# Patient Record
Sex: Female | Born: 1980 | Race: White | Hispanic: Yes | Marital: Single | Smoking: Never smoker
Health system: Southern US, Community
[De-identification: ages and names within clinical notes are randomized; demographics above are authoritative.]

## PROBLEM LIST (undated history)

## (undated) DIAGNOSIS — Z8759 Personal history of other complications of pregnancy, childbirth and the puerperium: Secondary | ICD-10-CM

## (undated) HISTORY — PX: OTHER SURGICAL HISTORY: SHX169

## (undated) HISTORY — DX: Personal history of other complications of pregnancy, childbirth and the puerperium: Z87.59

---

## 2006-05-16 ENCOUNTER — Emergency Department: Payer: Self-pay | Admitting: Unknown Physician Specialty

## 2006-09-14 ENCOUNTER — Encounter: Payer: Self-pay | Admitting: Maternal & Fetal Medicine

## 2007-01-17 ENCOUNTER — Inpatient Hospital Stay: Payer: Self-pay | Admitting: Unknown Physician Specialty

## 2007-12-01 ENCOUNTER — Emergency Department: Payer: Self-pay | Admitting: Internal Medicine

## 2008-05-19 IMAGING — US US OB DETAIL+14 WK - NRPT [facility]
1 series · 13 of 28 positions shown · non-contrast
Comparison: none

RESULT:      Ultrasound for dating

Dating:
Last menstrual period was April 06, 2006, with clinical age of 23 weeks 0
days and EDC of a January 11, 2007
Current ultrasound on 22 weeks 0 days with EDC January 18, 2007
Best overall assessment on September 14, 2006 is 23 weeks 0 days with EDC of
General evaluation:
Fetal heart rate activity present. Fetal heart rate 144 beats per minute.
Presentation: Cephalic.
Fetal movement: Visible.
Amniotic fluid: Normal.
Umbilical cord: 3 vessels.
Placenta: Anterior. No previa.
Biometry:
Biparietal diameter 51.2 mm consistent with 21 weeks 4 days.
Head circumference 186.1 mm consistent with 21 weeks 0 days. Abdominal
circumference 182.1 mm consistent with 23 weeks 0 days. Femur length 37.6 mm
consistent with 22 weeks 0 days.
Humerus length 36 mm consistent with 22 weeks 3 days.
Estimated fetal weight 489 grams or 1 pound 2 ounces.
Estimated fetal weight 31st percentile
Fetal anatomy:
Head: Cranium intact with normal.
Brain: Intracranial structures, including cerebellum, cisterna magna,
lateral ventricles, choroid plexus and cavum septum pellucidum appear
normal. Face: Nose and upper lip appear normal.
Neck: Appears normal.
Heart: Four-chamber view, right ventricular outflow tract, left ventricular
outflow tract of a ductus arteriosus and aortic arch were seen and appear
normal.
Abdominal wall: Umbilical cord insertion site appears normal.
Gastrointestinal tract: Stomach is on the left appears normal.
Kidneys: Left and right kidney appear normal.
Bladder: Appears normal.
Genitalia: Male.
Extremities: Upper and lower extremities including both hands and feet
appear normal.
Genetic sonogram:
Nuchal fold not measured.
Pyelectasis absent.
Hyperechogenic bowel absent.
Echogenic intracardiac focus absent.
Maternal structures:
Cervix measures 5.13 cm.

[Series 1: us ob detail+14 wk - nrpt [facility] · 0.29mm/px · 13 of 78 slices shown]
[im 3/78]
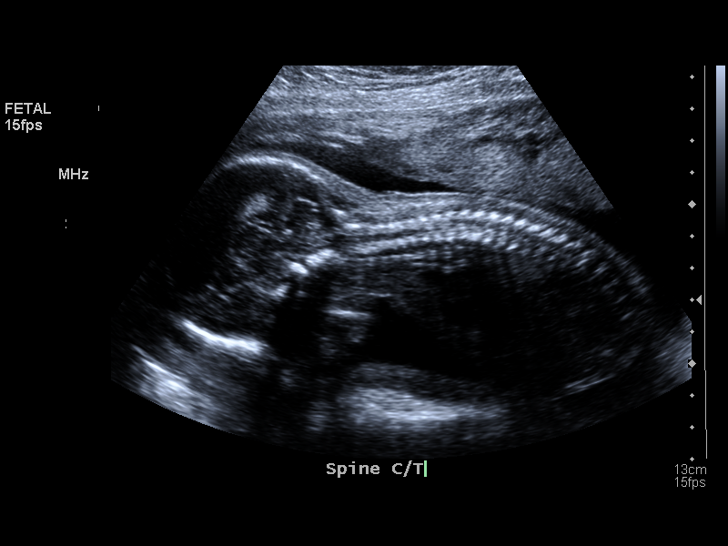
[im 9/78]
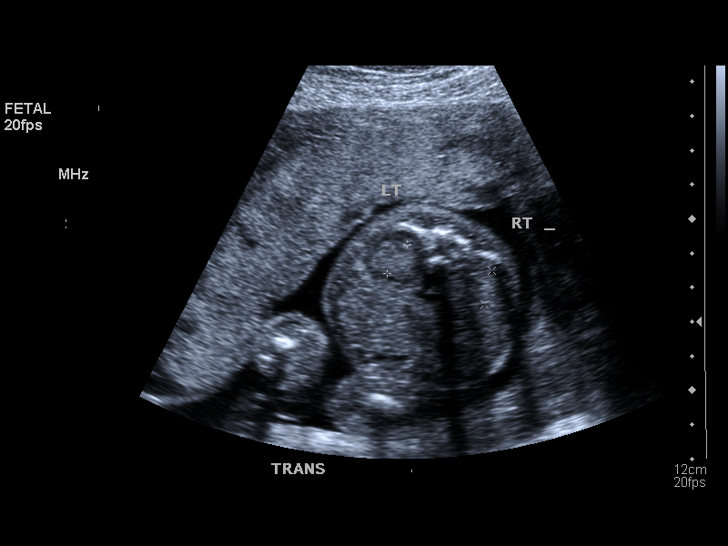
[im 15/78]
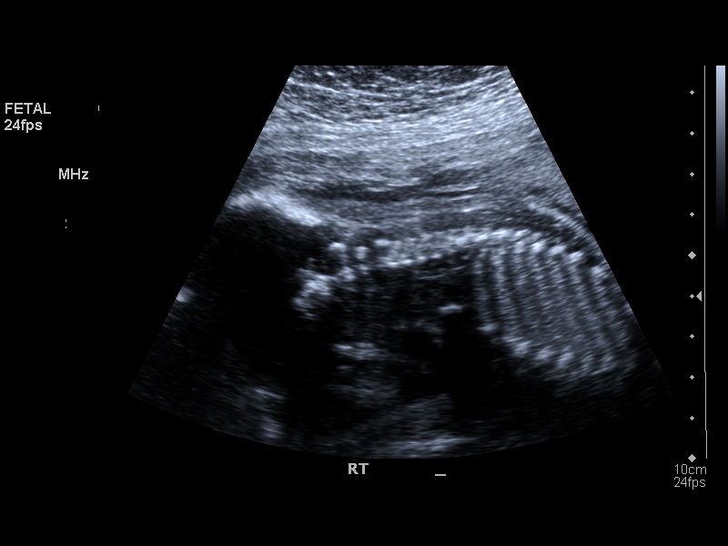
[im 20/78]
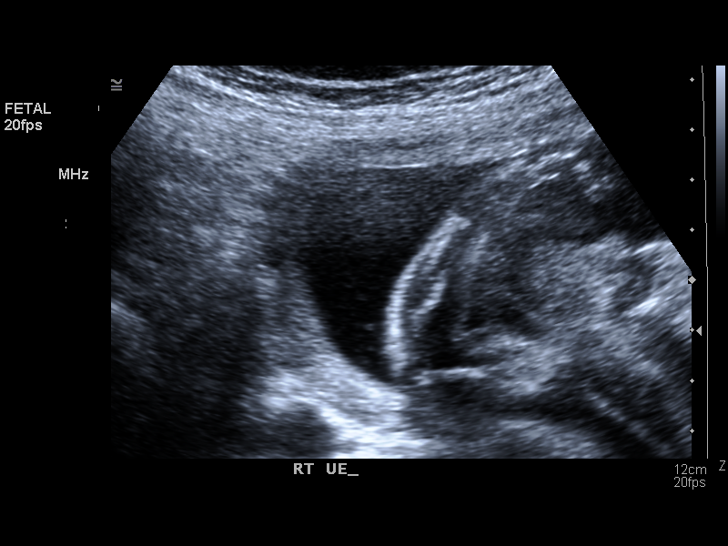
[im 26/78]
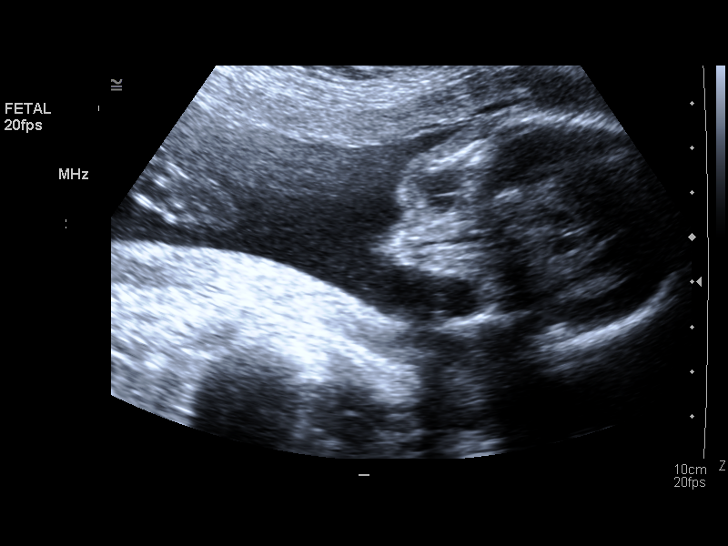
[im 32/78]
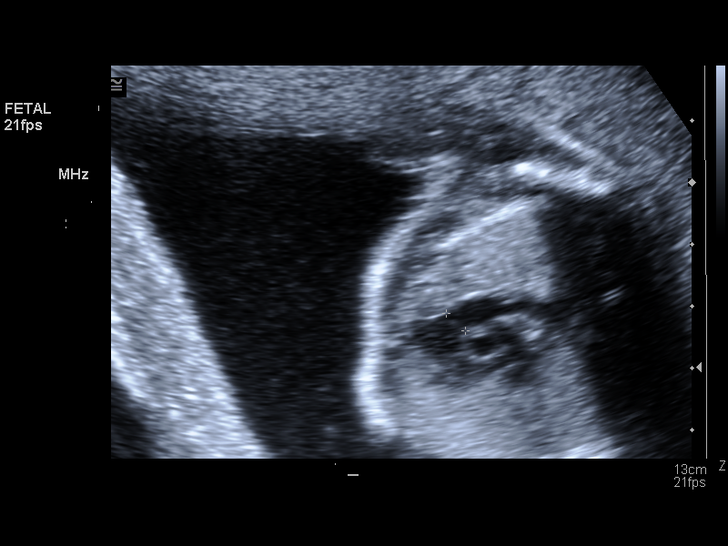
[im 40/78]
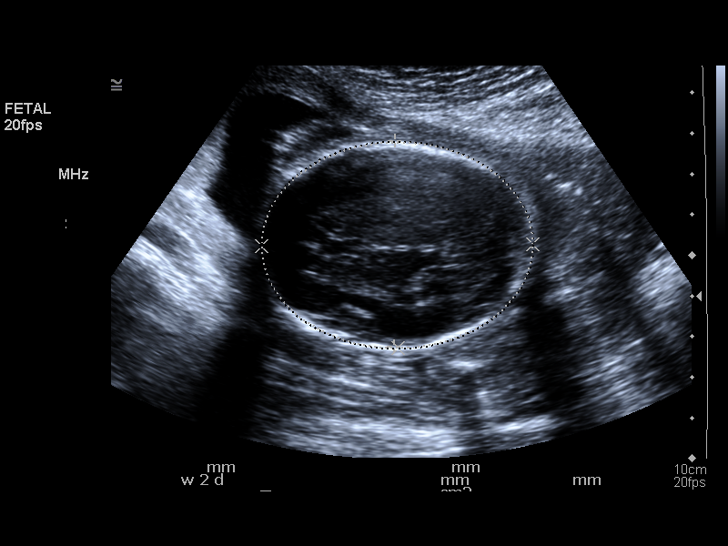
[im 46/78]
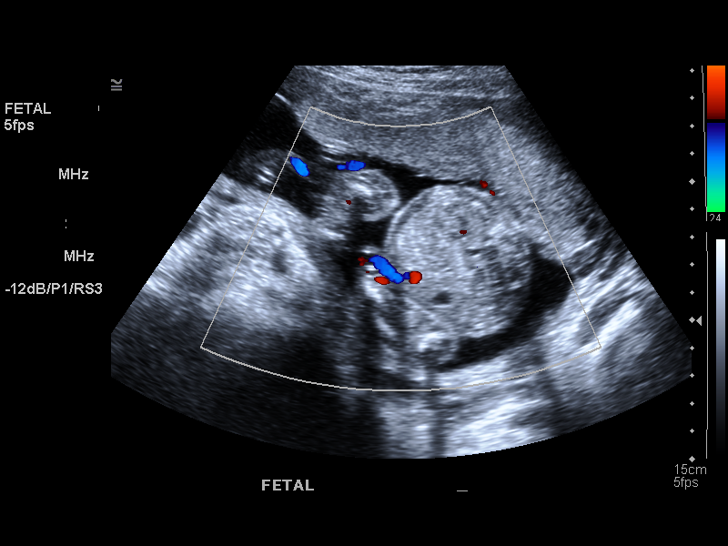
[im 52/78]
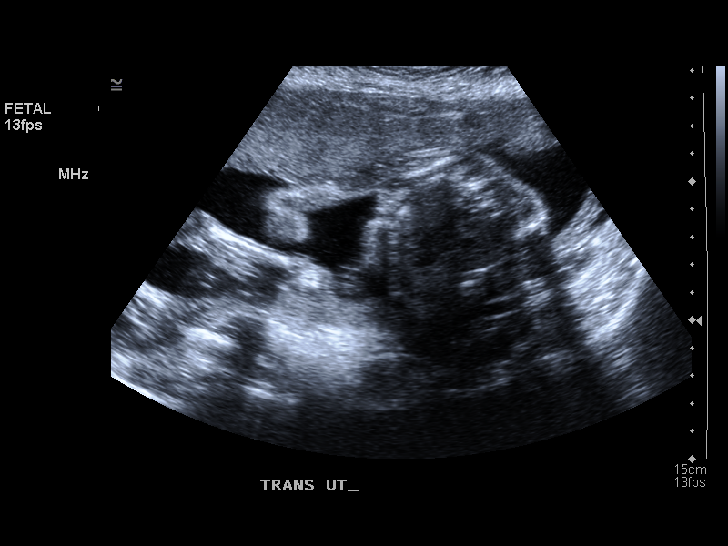
[im 58/78]
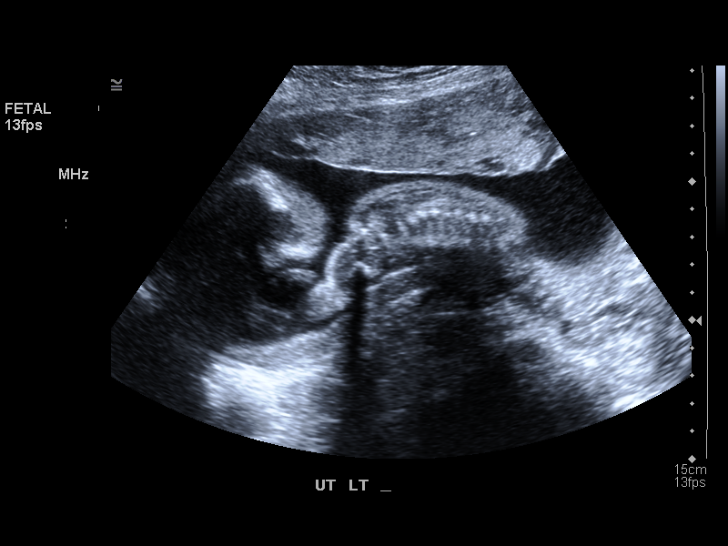
[im 63/78]
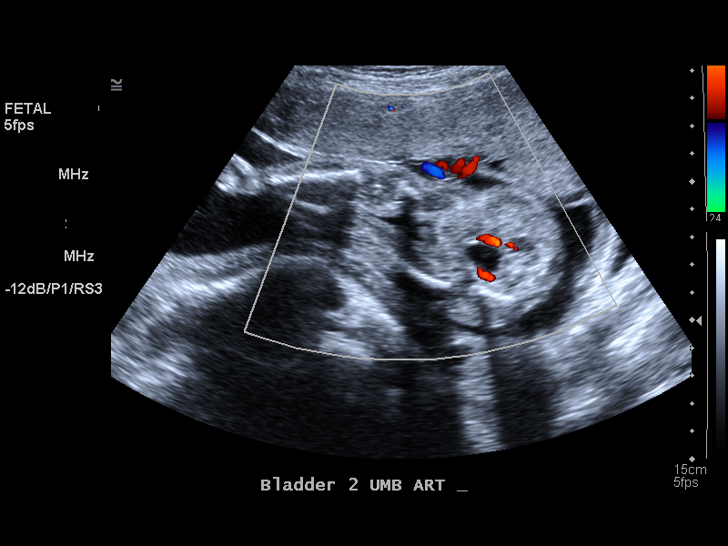
[im 69/78]
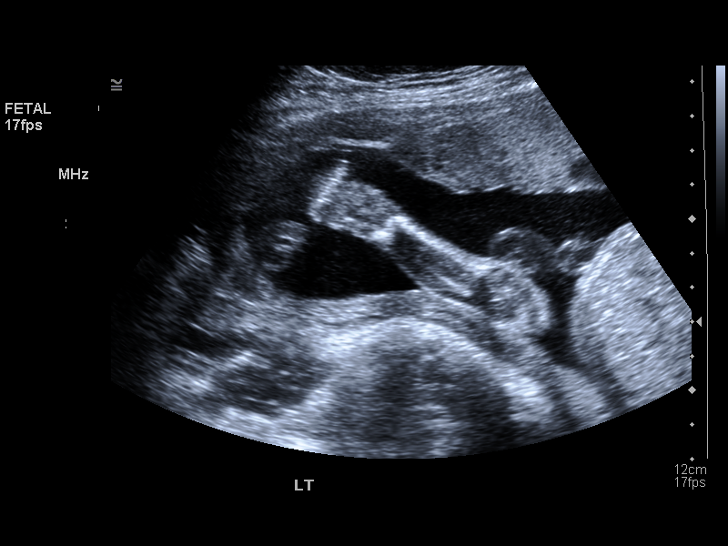
[im 75/78]
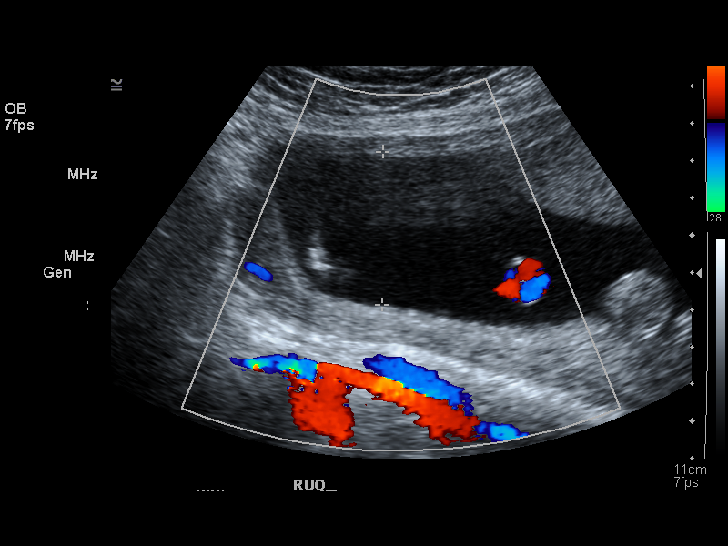

[13 of 28 positions shown; findings below may reference images not displayed]

IMPRESSION: Intrauterine pregnancy at 23 weeks 0 days.
Normal level II fetal anatomy.
No markers of aneuploidy identified.
Measurements consistent with stated EDC.

Recommendations:
Follow-up as clinically indicated.

Thank you for allowing us to participate in her care.

## 2009-08-05 IMAGING — US US OB < 14 WEEKS - US OB TV
1 series · 17 of 26 positions shown · non-contrast
Comparison: none

REASON FOR EXAM: pelvic pain
COMMENTS:

[Series 1: us ob < 14 weeks - us ob tv · 17 of 26 slices shown]
[im 1/26]
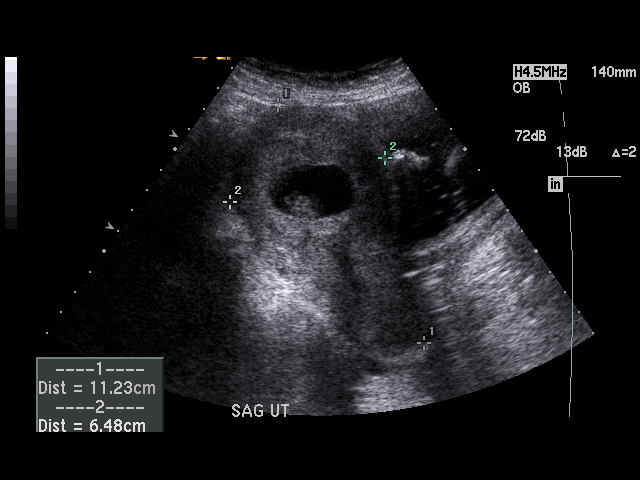
[im 3/26]
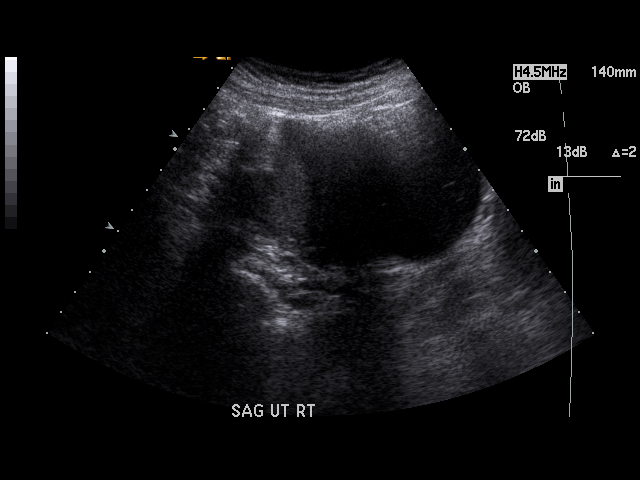
[im 4/26]
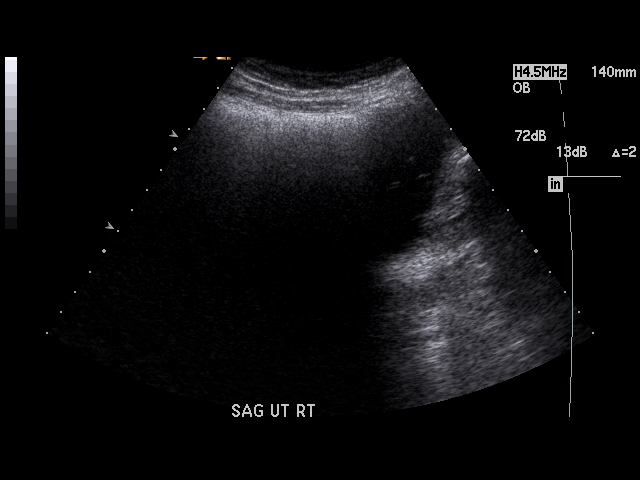
[im 6/26]
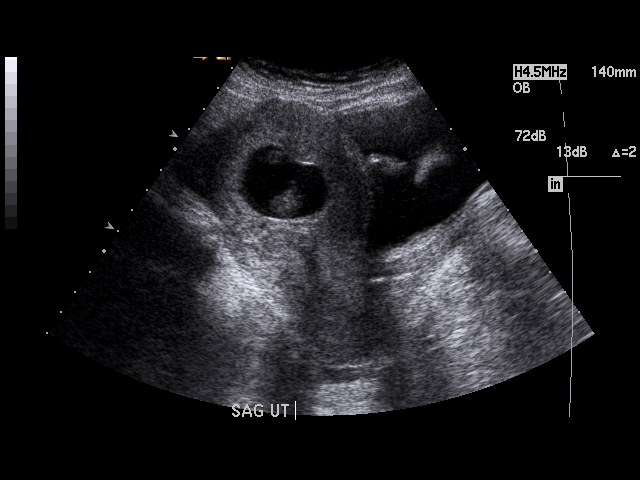
[im 7/26]
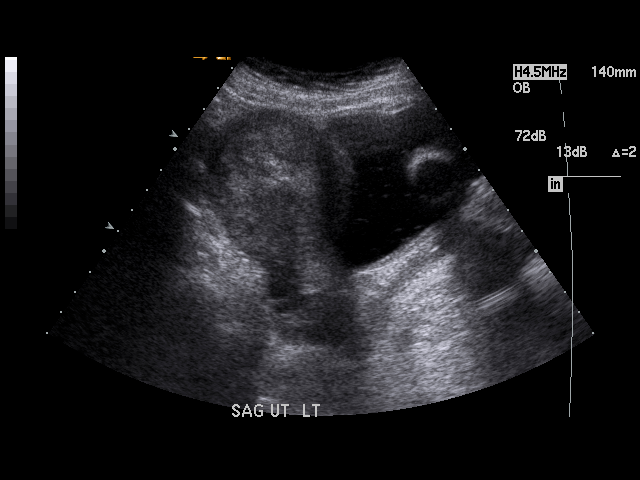
[im 9/26]
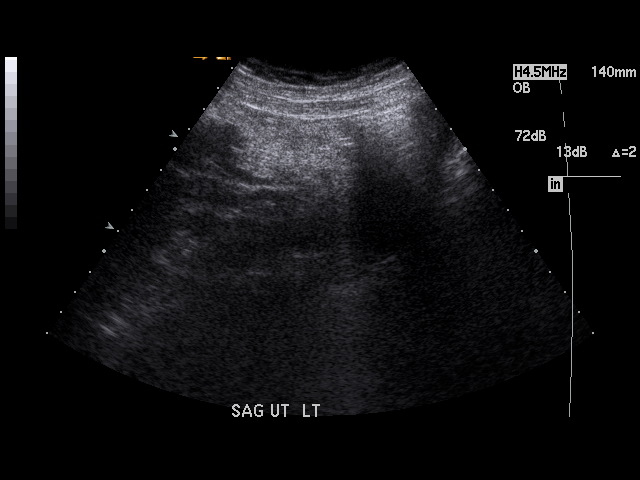
[im 10/26]
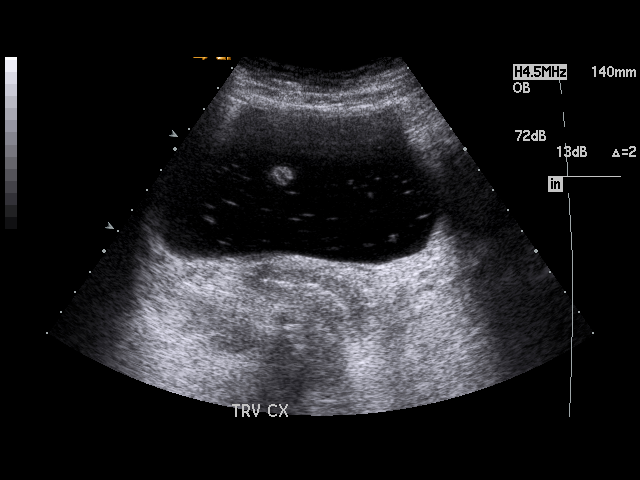
[im 12/26]
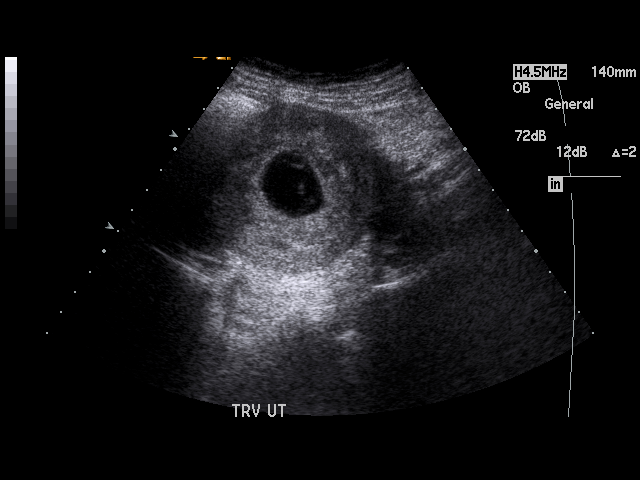
[im 14/26]
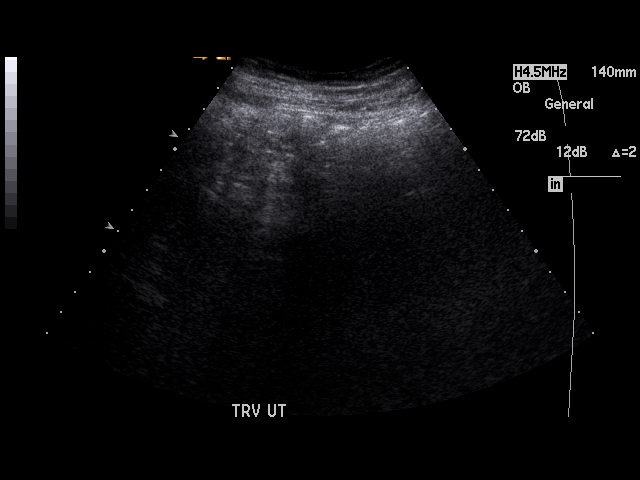
[im 15/26]
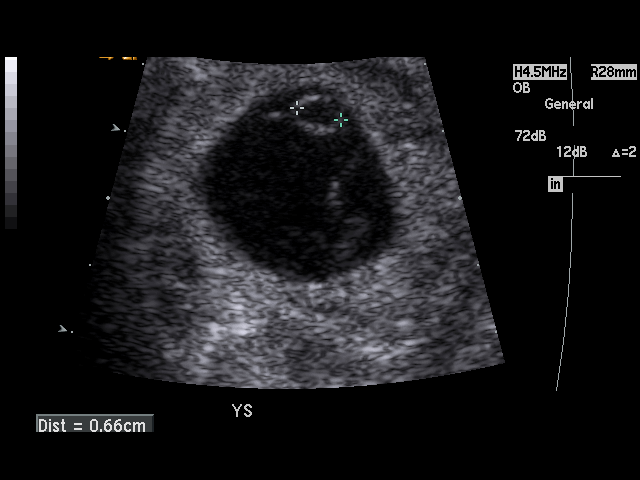
[im 17/26]
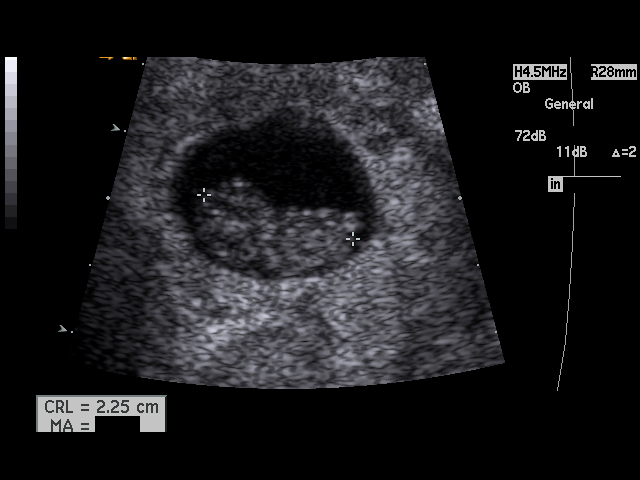
[im 18/26]
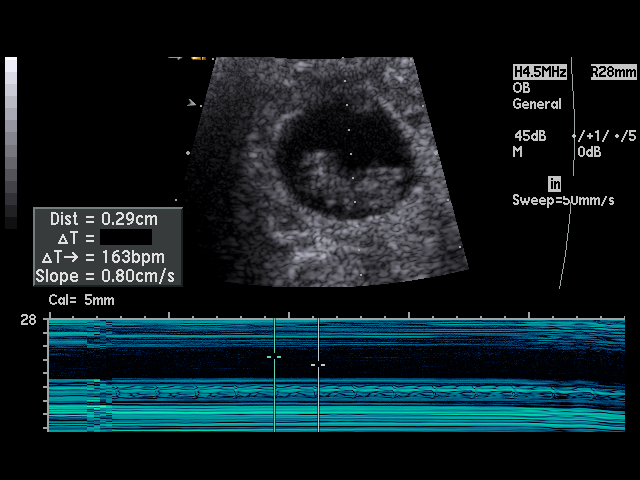
[im 20/26]
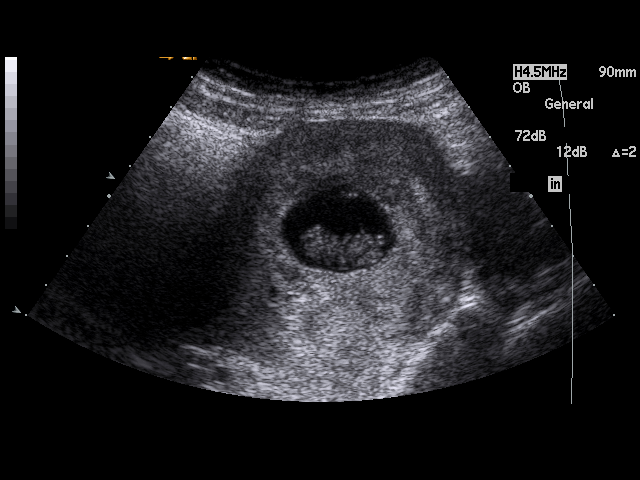
[im 21/26]
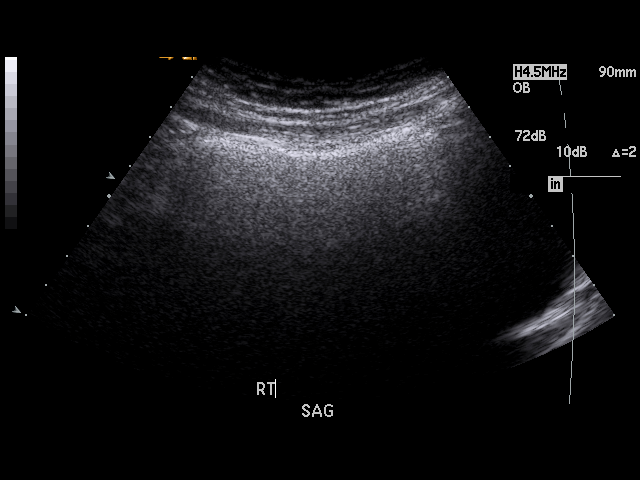
[im 23/26]
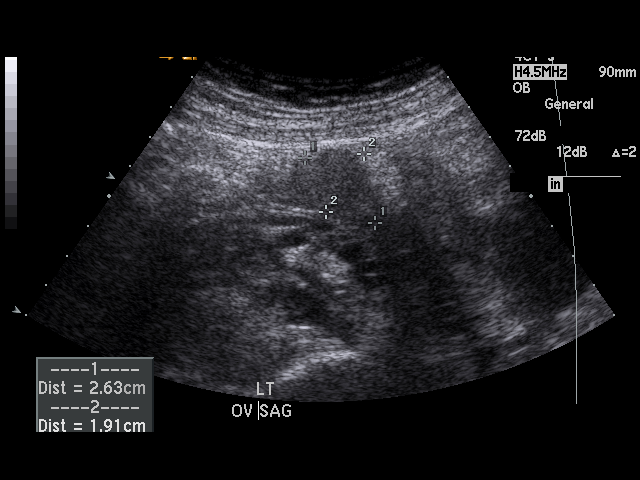
[im 24/26]
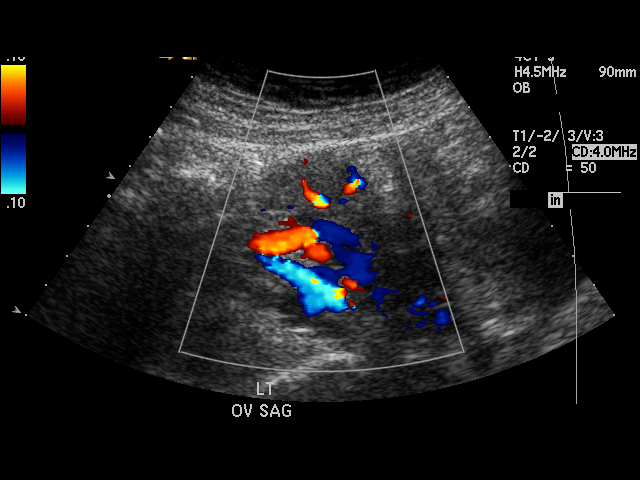
[im 26/26]
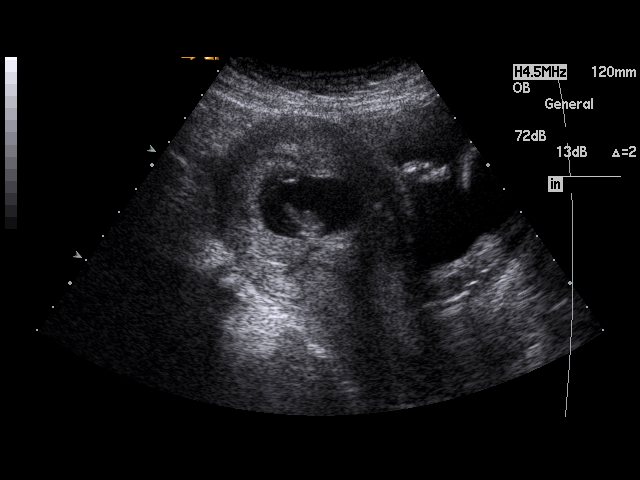

[17 of 26 positions shown; findings below may reference images not displayed]

PROCEDURE:     US  - US OB LESS THAN 14 WEEKS  - December 01, 2007  [DATE]

RESULT:     There is observed a single living intrauterine gestation. The
crown-rump length measures 2.22 cm which corresponds to an estimated
gestational age of 8 weeks, 6 days. Ultrasound EDD is July 06, 2008.

The yolk sac is also visualized. Embryo cardiac activity was monitored at
163 beats per minute. The LEFT maternal ovary is visualized and is normal in
appearance. The RIGHT ovary is not seen and apparently is obscured. No free
fluid is seen in the maternal pelvis.
IMPRESSION: 1.  Living intrauterine gestation of approximately 8 weeks, 6 days
gestational age.
2.  No abnormal adnexal masses are seen in the maternal pelvis.
3.  The RIGHT ovary is not seen and is apparently obscured.
3.  No free fluid is seen in the maternal pelvis.
4.  The visualized portion of the maternal urinary bladder shows no
significant abnormalities.

## 2009-08-09 ENCOUNTER — Emergency Department: Payer: Self-pay | Admitting: Emergency Medicine

## 2010-08-14 ENCOUNTER — Emergency Department: Payer: Self-pay | Admitting: Emergency Medicine

## 2012-07-24 ENCOUNTER — Emergency Department: Payer: Self-pay | Admitting: Emergency Medicine

## 2012-07-24 LAB — URINALYSIS, COMPLETE
Bacteria: NONE SEEN
Bilirubin,UR: NEGATIVE
Blood: NEGATIVE
Glucose,UR: NEGATIVE mg/dL (ref 0–75)
Leukocyte Esterase: NEGATIVE
Nitrite: NEGATIVE
Protein: NEGATIVE
Specific Gravity: 1.027 (ref 1.003–1.030)

## 2013-11-17 ENCOUNTER — Emergency Department: Payer: Self-pay | Admitting: Emergency Medicine

## 2013-11-17 LAB — URINALYSIS, COMPLETE
BILIRUBIN, UR: NEGATIVE
Blood: NEGATIVE
Glucose,UR: NEGATIVE mg/dL (ref 0–75)
Ketone: NEGATIVE
NITRITE: NEGATIVE
PH: 6 (ref 4.5–8.0)
Protein: 100
RBC,UR: 5 /HPF (ref 0–5)
Specific Gravity: 1.02 (ref 1.003–1.030)
WBC UR: 222 /HPF (ref 0–5)

## 2013-11-17 LAB — HCG, QUANTITATIVE, PREGNANCY: BETA HCG, QUANT.: 18772 m[IU]/mL — AB

## 2013-11-17 LAB — PREGNANCY, URINE: Pregnancy Test, Urine: POSITIVE m[IU]/mL

## 2015-07-23 IMAGING — US US OB < 14 WEEKS - US OB TV
1 series · 14 of 28 positions shown · non-contrast
Comparison: None available for comparison at time of study
interpretation.

CLINICAL DATA: Lower abdominal pain, dysuria.  Pregnancy.

EXAM:
OBSTETRIC <14 WK US AND TRANSVAGINAL OB US
TECHNIQUE: Both transabdominal and transvaginal ultrasound examinations were
performed for complete evaluation of the gestation as well as the
maternal uterus, adnexal regions, and pelvic cul-de-sac.
Transvaginal technique was performed to assess early pregnancy.

[Series 1: us ob < 14 weeks - us ob tv · 0.22mm/px · 41 acquisitions, 14 frames shown]
[im 2/41]
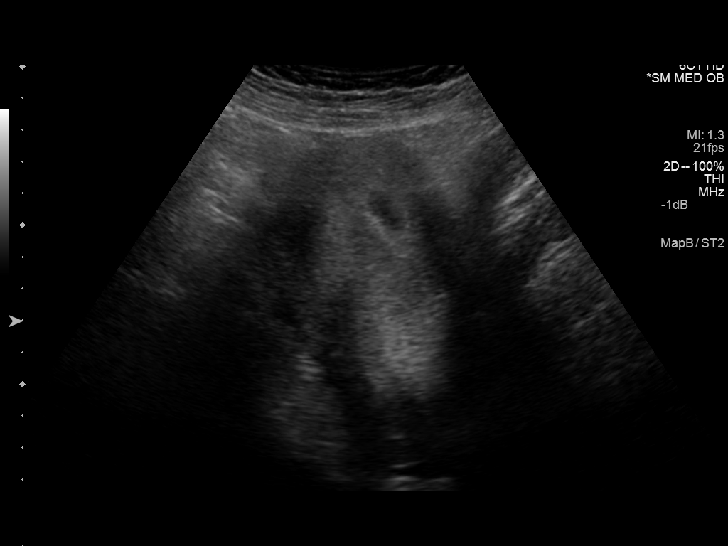
[im 5/41]
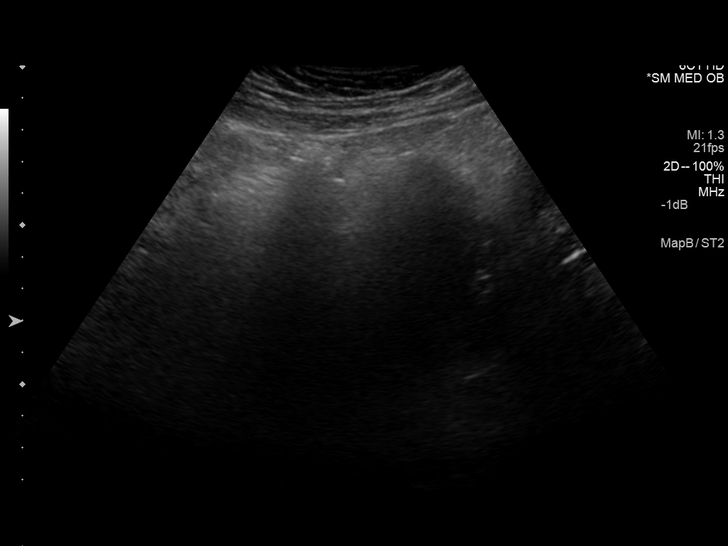
[im 8/41]
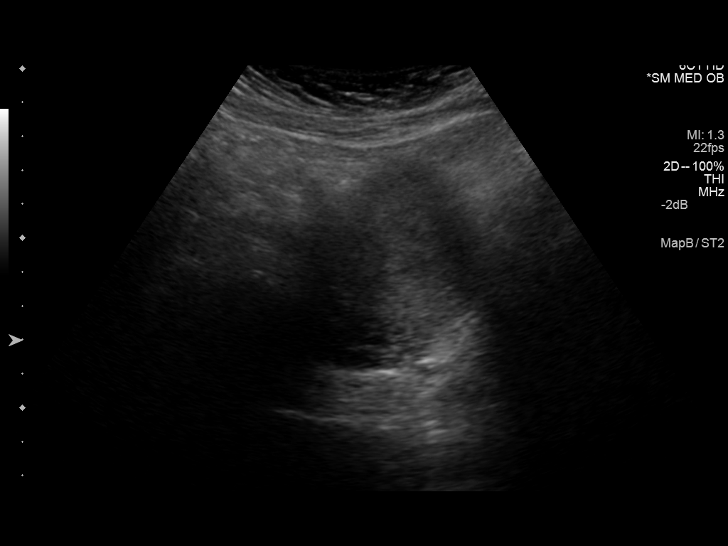
[im 11/41]
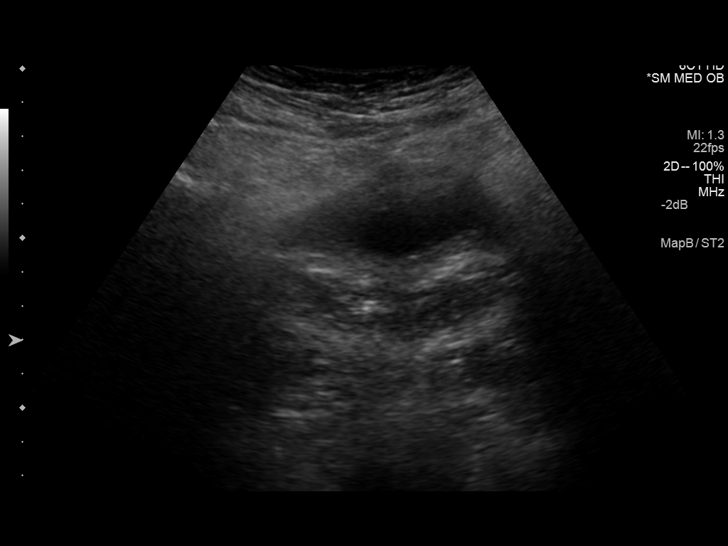
[im 14/41]
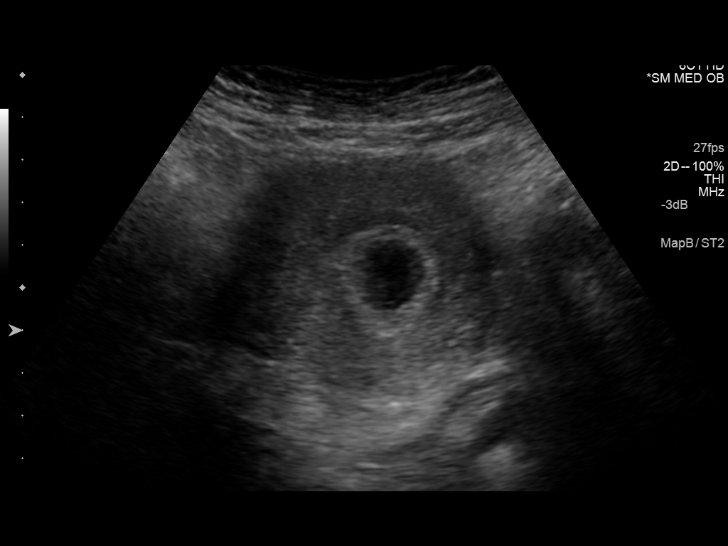
[im 17/41]
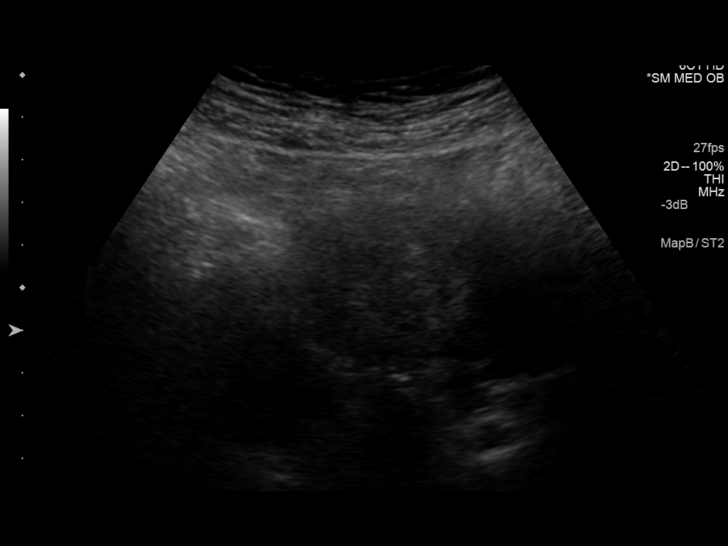
[im 20/41]
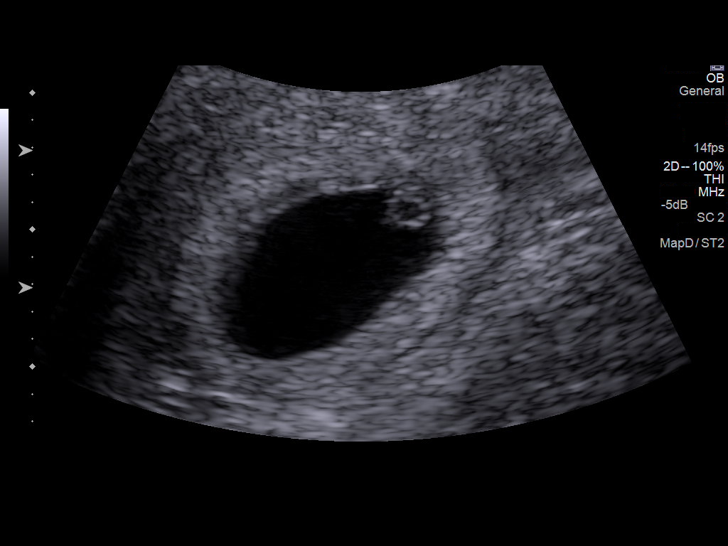
[im 23/41]
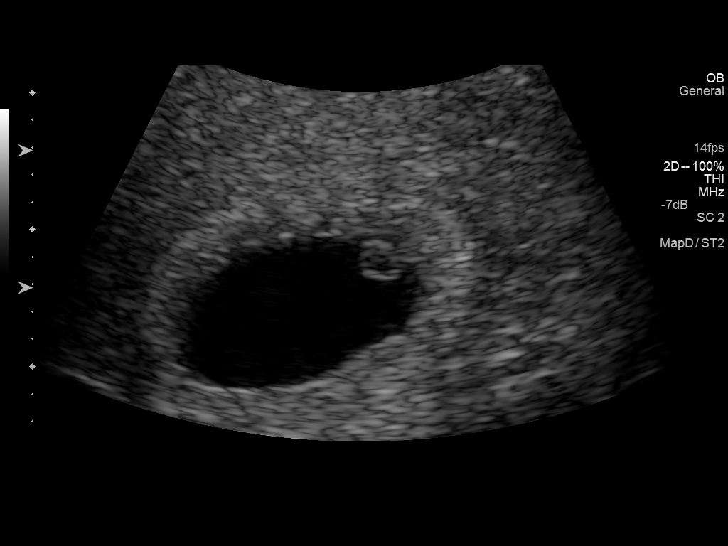
[im 26/41]
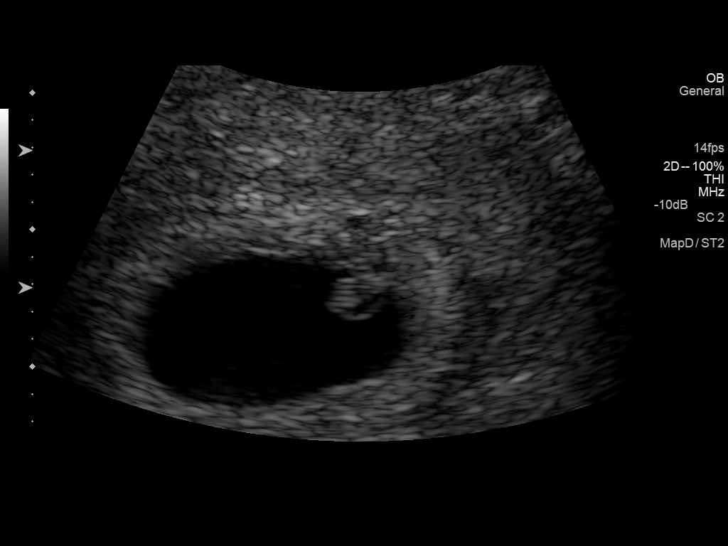
[im 29/41]
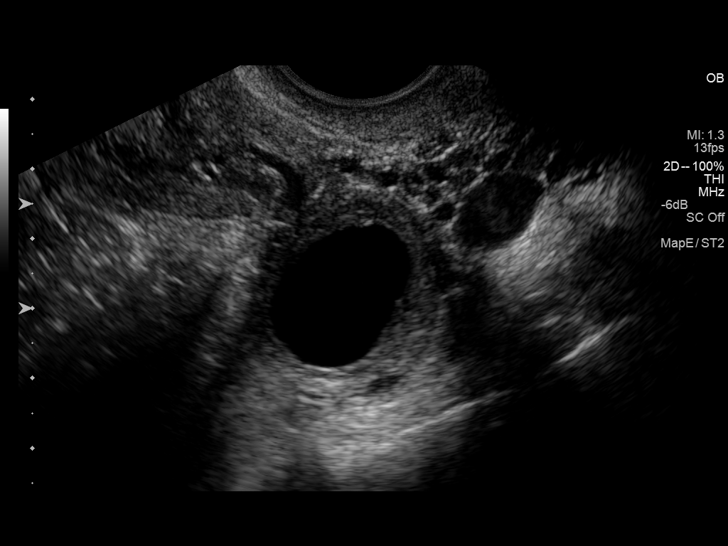
[im 32/41]
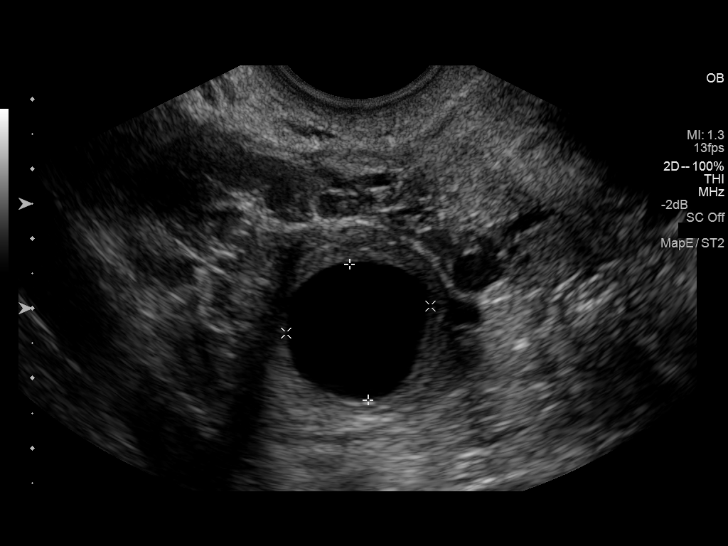
[im 35/41]
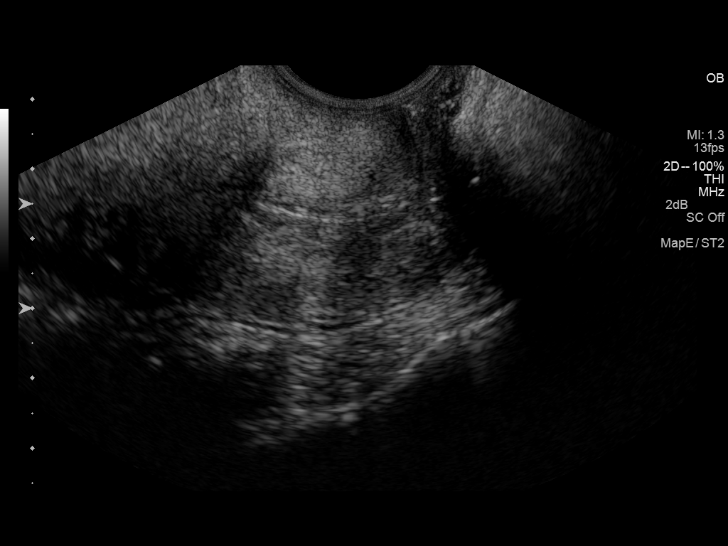
[im 38/41]
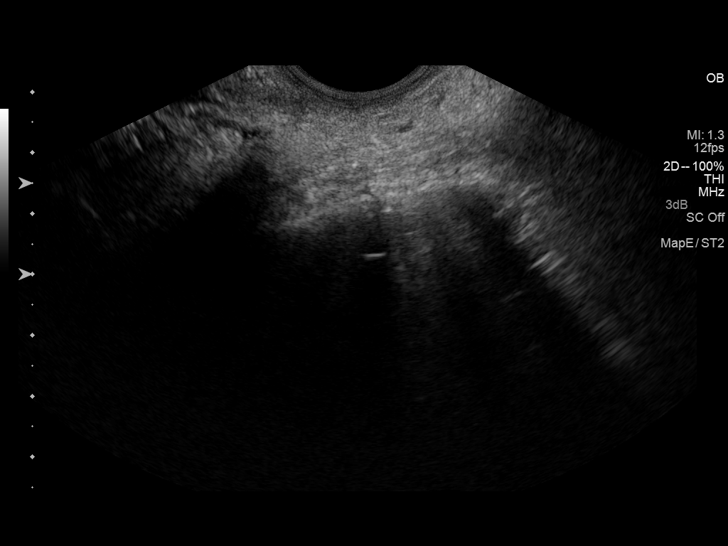
[im 41/41]
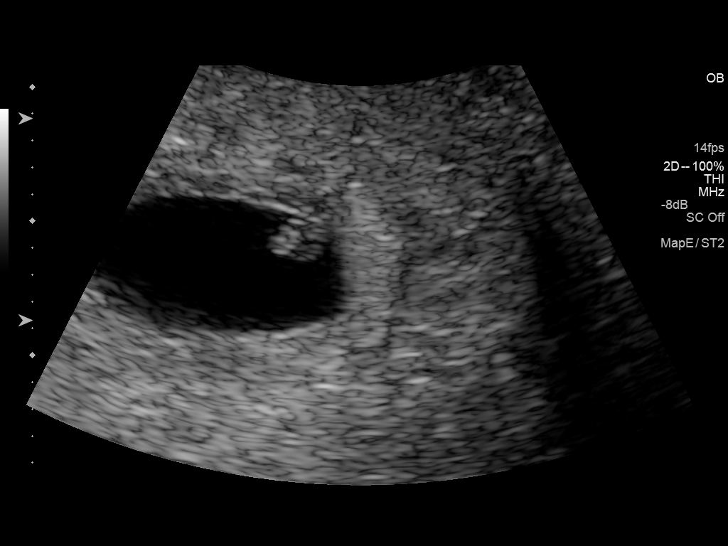

[14 of 28 positions shown; findings below may reference images not displayed]

FINDINGS: Intrauterine gestational sac: Visualized/normal in shape.

Yolk sac:  Present.

Embryo:  Present.

Cardiac Activity: Present.

Heart Rate:  108 bpm

CRL:   3  mm   5 w 6 d                  US EDC: July 14, 2014.

Maternal uterus/adnexae: Left ovary was sonographically occult,
possibly obscured by bowel gas. 2.2 cm anechoic cyst in right ovary
could reflect an involuting corpus luteal cyst.
IMPRESSION: Single live intrauterine pregnancy, gestational age 5 weeks and 6
days by ultrasound. Recommend standard followup.

  By: Zobeida Guidry

## 2016-02-15 ENCOUNTER — Other Ambulatory Visit: Payer: Self-pay | Admitting: Physician Assistant

## 2016-02-15 DIAGNOSIS — O09522 Supervision of elderly multigravida, second trimester: Secondary | ICD-10-CM

## 2016-02-21 ENCOUNTER — Ambulatory Visit: Payer: Self-pay

## 2016-04-15 DIAGNOSIS — Z8759 Personal history of other complications of pregnancy, childbirth and the puerperium: Secondary | ICD-10-CM | POA: Insufficient documentation

## 2016-06-09 LAB — HIV ANTIBODY (ROUTINE TESTING W REFLEX): HIV 1&2 Ab, 4th Generation: NONREACTIVE

## 2016-07-31 DIAGNOSIS — Z302 Encounter for sterilization: Secondary | ICD-10-CM | POA: Insufficient documentation

## 2017-12-15 DIAGNOSIS — Z550 Illiteracy and low-level literacy: Secondary | ICD-10-CM | POA: Insufficient documentation

## 2017-12-15 LAB — HM PAP SMEAR: HM Pap smear: NEGATIVE

## 2018-10-01 DIAGNOSIS — Z550 Illiteracy and low-level literacy: Secondary | ICD-10-CM

## 2019-05-02 ENCOUNTER — Ambulatory Visit (LOCAL_COMMUNITY_HEALTH_CENTER): Payer: Self-pay | Admitting: Physician Assistant

## 2019-05-02 ENCOUNTER — Other Ambulatory Visit: Payer: Self-pay

## 2019-05-02 ENCOUNTER — Encounter: Payer: Self-pay | Admitting: Physician Assistant

## 2019-05-02 VITALS — BP 101/68 | Ht 60.0 in | Wt 126.4 lb

## 2019-05-02 DIAGNOSIS — Z3009 Encounter for other general counseling and advice on contraception: Secondary | ICD-10-CM

## 2019-05-02 DIAGNOSIS — Z30013 Encounter for initial prescription of injectable contraceptive: Secondary | ICD-10-CM

## 2019-05-02 DIAGNOSIS — Z3042 Encounter for surveillance of injectable contraceptive: Secondary | ICD-10-CM

## 2019-05-02 MED ORDER — MEDROXYPROGESTERONE ACETATE 150 MG/ML IM SUSP
150.0000 mg | Freq: Once | INTRAMUSCULAR | Status: AC
  Administered 2019-05-02: 150 mg via INTRAMUSCULAR

## 2019-05-02 NOTE — Progress Notes (Signed)
Here today for Depo. Reports no problems with Depo. Verbal order obtained from provider C. Lazarus Salines, [place] for a one time Depo. Depo given and reviewed with patient next Depo and RP will be due ~ 07/18/2019. Patient verbalized understanding of same. Hal Morales, RN

## 2019-11-23 ENCOUNTER — Encounter: Payer: Self-pay | Admitting: Advanced Practice Midwife

## 2019-11-23 ENCOUNTER — Ambulatory Visit (LOCAL_COMMUNITY_HEALTH_CENTER): Payer: Self-pay | Admitting: Advanced Practice Midwife

## 2019-11-23 ENCOUNTER — Other Ambulatory Visit: Payer: Self-pay

## 2019-11-23 VITALS — BP 102/61 | Ht 65.0 in | Wt 130.2 lb

## 2019-11-23 DIAGNOSIS — Z3009 Encounter for other general counseling and advice on contraception: Secondary | ICD-10-CM

## 2019-11-23 DIAGNOSIS — Z30013 Encounter for initial prescription of injectable contraceptive: Secondary | ICD-10-CM

## 2019-11-23 LAB — PREGNANCY, URINE: Preg Test, Ur: NEGATIVE

## 2019-11-23 MED ORDER — MEDROXYPROGESTERONE ACETATE 150 MG/ML IM SUSP
150.0000 mg | Freq: Once | INTRAMUSCULAR | Status: AC
  Administered 2019-11-23: 150 mg via INTRAMUSCULAR

## 2019-11-23 NOTE — Progress Notes (Signed)
   [facility] problem visit  [facility]  Subjective:  Anita Ford is a 39 y.o. SHF nonsmoker being seen today for DMPA reinitiation  Chief Complaint  Patient presents with  . Contraception    HPI  Received a one time DMPA 05/02/19 and didn't come back.  Now wants DMPA.  LMP 11/16/19.  Poor historian.  Last sex ?09/24/19?Marland Kitchen  Last pap 12/15/17 neg Does the patient have a current or past history of drug use? No   No components found for: HCV]   Health Maintenance Due  Topic Date Due  . TETANUS/TDAP  Never done    ROS  The following portions of the patient's history were reviewed and updated as appropriate: allergies, current medications, past family history, past medical history, past social history, past surgical history and problem list. Problem list updated.   See flowsheet for other program required questions.  Objective:   Vitals:   11/23/19 1609  BP: 102/61  Weight: 130 lb 3.2 oz (59.1 kg)  Height: 5\' 5"  (1.651 m)    Physical Exam  n/a  Assessment and Plan:  Anita Ford is a 39 y.o. female presenting to the [facility] Department for a Women's Health problem visit  1. Family planning Wants to restart DMPA  2. Encounter for initial prescription of injectable contraceptive If PT neg today may have DMPA 150 mg IM and q 11-13 wks x 1 year Please counsel on need for abstinance/back up condoms next 7 days     Return for 11-13 wk DMPA.  No future appointments.  [facility], CNM

## 2019-11-23 NOTE — Progress Notes (Signed)
In desiring Depo restart Sharlette Dense, RN PT Neg-verbal order per CNM for Depo Sharlette Dense, RN

## 2020-02-08 ENCOUNTER — Ambulatory Visit (LOCAL_COMMUNITY_HEALTH_CENTER): Payer: Self-pay

## 2020-02-08 ENCOUNTER — Other Ambulatory Visit: Payer: Self-pay

## 2020-02-08 VITALS — BP 97/71 | Ht 65.0 in | Wt 130.0 lb

## 2020-02-08 DIAGNOSIS — Z3009 Encounter for other general counseling and advice on contraception: Secondary | ICD-10-CM

## 2020-02-08 DIAGNOSIS — Z30013 Encounter for initial prescription of injectable contraceptive: Secondary | ICD-10-CM

## 2020-02-08 MED ORDER — MEDROXYPROGESTERONE ACETATE 150 MG/ML IM SUSP
150.0000 mg | Freq: Once | INTRAMUSCULAR | Status: AC
  Administered 2020-02-08: 150 mg via INTRAMUSCULAR

## 2020-02-08 MED ORDER — MULTI-VITAMIN/MINERALS PO TABS: 1.0000 | ORAL_TABLET | Freq: Every day | ORAL | 0 refills | Status: AC

## 2020-02-08 NOTE — Progress Notes (Signed)
Depo administered per 11/23/2019 written order of Sadie Haber PA and client tolerated without complaint. Jossie Ng, RN

## 2020-04-27 ENCOUNTER — Other Ambulatory Visit: Payer: Self-pay

## 2020-04-27 ENCOUNTER — Ambulatory Visit (LOCAL_COMMUNITY_HEALTH_CENTER): Payer: Self-pay

## 2020-04-27 VITALS — BP 100/75 | Ht 65.0 in | Wt 128.5 lb

## 2020-04-27 DIAGNOSIS — Z3042 Encounter for surveillance of injectable contraceptive: Secondary | ICD-10-CM

## 2020-04-27 DIAGNOSIS — Z3009 Encounter for other general counseling and advice on contraception: Secondary | ICD-10-CM

## 2020-04-27 MED ORDER — MEDROXYPROGESTERONE ACETATE 150 MG/ML IM SUSP
150.0000 mg | Freq: Once | INTRAMUSCULAR | Status: AC
  Administered 2020-04-27: 150 mg via INTRAMUSCULAR

## 2020-04-27 NOTE — Progress Notes (Signed)
Pt is 11.2 weeks post depo today. DMPA 150 mg IM administered per Arnetha Courser, CNM order dated 11/23/19.

## 2020-07-16 ENCOUNTER — Ambulatory Visit (LOCAL_COMMUNITY_HEALTH_CENTER): Payer: Self-pay

## 2020-07-16 ENCOUNTER — Other Ambulatory Visit: Payer: Self-pay

## 2020-07-16 VITALS — BP 109/75 | Ht 65.0 in | Wt 129.0 lb

## 2020-07-16 DIAGNOSIS — Z3042 Encounter for surveillance of injectable contraceptive: Secondary | ICD-10-CM

## 2020-07-16 DIAGNOSIS — Z3009 Encounter for other general counseling and advice on contraception: Secondary | ICD-10-CM

## 2020-07-16 MED ORDER — MEDROXYPROGESTERONE ACETATE 150 MG/ML IM SUSP
150.0000 mg | Freq: Once | INTRAMUSCULAR | Status: AC
  Administered 2020-07-16: 150 mg via INTRAMUSCULAR

## 2020-07-16 NOTE — Progress Notes (Signed)
11 weeks 3 days post depo. Depo 150mg  IM given RUOQ per order by , CNM dated 11/23/2019. Tolerated well. Next depo due 10/01/2020, pt aware. 10/03/2020, RN

## 2020-10-01 ENCOUNTER — Ambulatory Visit: Payer: Self-pay

## 2020-10-04 ENCOUNTER — Ambulatory Visit: Payer: Self-pay

## 2020-10-08 ENCOUNTER — Ambulatory Visit: Payer: Self-pay

## 2020-10-10 ENCOUNTER — Other Ambulatory Visit: Payer: Self-pay

## 2020-10-10 ENCOUNTER — Ambulatory Visit (LOCAL_COMMUNITY_HEALTH_CENTER): Payer: Self-pay | Admitting: Family Medicine

## 2020-10-10 ENCOUNTER — Encounter: Payer: Self-pay | Admitting: Family Medicine

## 2020-10-10 VITALS — BP 99/69 | Ht 60.5 in | Wt 133.2 lb

## 2020-10-10 DIAGNOSIS — Z3042 Encounter for surveillance of injectable contraceptive: Secondary | ICD-10-CM

## 2020-10-10 DIAGNOSIS — Z0000 Encounter for general adult medical examination without abnormal findings: Secondary | ICD-10-CM

## 2020-10-10 DIAGNOSIS — Z3009 Encounter for other general counseling and advice on contraception: Secondary | ICD-10-CM

## 2020-10-10 MED ORDER — MEDROXYPROGESTERONE ACETATE 150 MG/ML IM SUSP
150.0000 mg | INTRAMUSCULAR | Status: AC
  Administered 2020-10-10 – 2021-07-23 (×3): 150 mg via INTRAMUSCULAR

## 2020-10-10 NOTE — Progress Notes (Signed)
Family Planning Visit- Repeat Yearly Visit  Subjective:  Anita Ford is a 40 y.o. [id number]  being seen today for an well woman visit and to discuss family planning options.    She is currently using Depo Provera for pregnancy prevention. Patient reports she does not want a pregnancy in the next year. 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  Chief Complaint  Patient presents with   Annual Exam   Contraception    Patient reports here for physical and depo  Patient denies any problems or concerns  See flowsheet for other program required questions.   Body mass index is 25.59 kg/m. - Patient is eligible for diabetes screening based on BMI and age >56?  no HA1C ordered? no  Patient reports 1 of partners in last year. Desires STI screening?  No - declined    Has patient been screened once for HCV in the past?  No  No results found for: HCVAB  Does the patient have current of drug use, have a partner with drug use, and/or has been incarcerated since last result? No  If yes-- Screen for HCV through [facility]   Does the patient meet criteria for HBV testing? No  Criteria:  -Household, sexual or needle sharing contact with HBV -History of drug use -HIV positive -Those with known Hep C   Health Maintenance Due  Topic Date Due   Hepatitis C Screening  Never done   COVID-19 Vaccine (1) Never done   TETANUS/TDAP  Never done   INFLUENZA VACCINE  Never done    ROS  The following portions of the patient's history were reviewed and updated as appropriate: allergies, current medications, past family history, past medical history, past social history, past surgical history and problem list. Problem list updated.  Objective:   Vitals:   10/10/20 1413  BP: 99/69  Weight: 133 lb 3.2 oz (60.4 kg)  Height: 5' 0.5" (1.537 m)    Physical Exam    Assessment and Plan:  Anita Ford is a 40 y.o. female [phone] presenting to the [facility] Department for an yearly well woman exam/family planning visit  1.. Routine general medical examination at a health care facility 2. Family planning counseling 3. Encounter for surveillance of injectable contraceptive  Patient here for well woman exam, Pt  Pap and CBE  is due 12/15/2020.  Declined to have done today because she has her children present.  Informed patient of due date.    patient wants to continue with depo, no problems or concerns with depo.   Pt ok to continue with depo provera Q 11-13 weeks x year  - medroxyPROGESTERone (DEPO-PROVERA) injection 150 mg  Contraception counseling: Reviewed all forms of birth control options in the tiered based approach. available including abstinence; over the counter/barrier methods; hormonal contraceptive medication including pill, patch, ring, injection,contraceptive implant, ECP; hormonal and nonhormonal IUDs; permanent sterilization options including vasectomy and the various tubal sterilization modalities. Risks, benefits, and typical effectiveness rates were reviewed.  Questions were answered.  Written information was also given to the patient to review.  Patient desires depo , this was prescribed for patient. She will follow up in  3 months  for surveillance.  She was told to call with any further questions, or with any concerns about this method of contraception.  Emphasized use of condoms 100% of the time for STI prevention.  Patient was  Not offered ECP based on current on depo provera.  There are no diagnoses linked to this encounter.    Return in about 11 weeks (around 12/26/2020) for Depo.  No future appointments.  Spanish interpreter language line used  Wendi Snipes, FNP

## 2020-10-10 NOTE — Progress Notes (Addendum)
Patient here for PE and Depo. Patient was given [place] family history form. Patient completed most of the form and states she understood most of it. Patient and RN completed the rest of the form with help of interpreter, and patient states she is satisfied that she has filled it out properly after given chance to ask questions. Last PE and Pap in 11/2017. Pap negative, HPV negative. Last Depo given 07/16/2020, patient is 12 2/7 since last Depo. Here with 2 young daughters.Burt Knack, RN

## 2020-10-10 NOTE — Progress Notes (Signed)
Depo given per provider orders. Given right glute, tolerated well, next Depo card given.Burt Knack, RN

## 2020-12-26 ENCOUNTER — Ambulatory Visit: Payer: Self-pay

## 2020-12-31 ENCOUNTER — Other Ambulatory Visit: Payer: Self-pay

## 2020-12-31 ENCOUNTER — Ambulatory Visit (LOCAL_COMMUNITY_HEALTH_CENTER): Payer: Self-pay

## 2020-12-31 VITALS — BP 94/71 | Ht 60.51 in | Wt 131.5 lb

## 2020-12-31 DIAGNOSIS — Z3042 Encounter for surveillance of injectable contraceptive: Secondary | ICD-10-CM

## 2020-12-31 DIAGNOSIS — Z3009 Encounter for other general counseling and advice on contraception: Secondary | ICD-10-CM

## 2020-12-31 NOTE — Progress Notes (Signed)
11 weeks 5 days post depo. Voices no concerns. Depo given today per order by Elveria Rising, FNP dated 10/10/2020.Tolerated well LUOQ. Per note by Elveria Rising, FNP 10/10/2020 at pt's annual exam, pt needs pap and CBE. Pt notified of this. Plans to schedule provider visit at next depo to have pap, CBE, and depo. Pt has reminder card. M. [place], interpreter. Jerel Shepherd, RN

## 2021-07-23 ENCOUNTER — Other Ambulatory Visit: Payer: Self-pay

## 2021-07-23 ENCOUNTER — Ambulatory Visit (LOCAL_COMMUNITY_HEALTH_CENTER): Payer: Self-pay | Admitting: Advanced Practice Midwife

## 2021-07-23 VITALS — BP 99/70 | HR 101 | Temp 98.0°F | Ht 60.05 in | Wt 135.6 lb

## 2021-07-23 DIAGNOSIS — Z3009 Encounter for other general counseling and advice on contraception: Secondary | ICD-10-CM

## 2021-07-23 DIAGNOSIS — Z3042 Encounter for surveillance of injectable contraceptive: Secondary | ICD-10-CM

## 2021-07-23 NOTE — Progress Notes (Signed)
Per verbal order E. Sciora CNM, may administer Depo 150 mg IM today. Client tolerated without complaint. Jossie Ng, RN

## 2021-07-23 NOTE — Progress Notes (Signed)
[facility] [address] Main Number: [phone]  Contraception/Family Planning VISIT ENCOUNTER NOTE  Subjective:   Anita Ford is a 40 y.o. SHF nonsmoker [id number] female here for reproductive life counseling. The patient is currently using No Method - Other Reason to prevent pregnancy.  Desires restart of DMPA for  BCM.  The patient does not want a pregnancy in the next year.  Last physical 10/10/20. Last sex 03/30/21. LMP 07/15/21. Last pap 12/15/17 neg HPV neg. Doesn't want another pregnancy. Last DMPA 12/31/20 (29.1 wks). Denies cigs, vapinig, cigars, MJ. Last ETOH 08/11/20 (2 glasses wine).    Denies abnormal vaginal bleeding, discharge, pelvic pain, problems with intercourse or other gynecologic concerns.    Gynecologic History Patient's last menstrual period was 07/15/2021 (exact date).  Health Maintenance Due  Topic Date Due   COVID-19 Vaccine (1) Never done   Hepatitis C Screening  Never done   TETANUS/TDAP  Never done   PAP SMEAR-Modifier  12/15/2020   INFLUENZA VACCINE  Never done     The following portions of the patient's history were reviewed and updated as appropriate: allergies, current medications, past family history, past medical history, past social history, past surgical history and problem list.  Review of Systems Pertinent items are noted in HPI.   Objective:  BP 99/70   Pulse (!) 101   Temp 98 F (36.7 C)   Ht 5' 0.05" (1.525 m)   Wt 135 lb 9.6 oz (61.5 kg)   LMP 07/15/2021 (Exact Date)   BMI 26.44 kg/m  Gen: well appearing, NAD HEENT: no scleral icterus CV: RR Lung: Normal WOB Ext: warm well perfused     Assessment and Plan:   Contraception counseling: Reviewed all forms of birth control options in the tiered based approach. available including abstinence; over the counter/barrier methods; hormonal contraceptive medication including pill, patch, ring, injection,contraceptive  implant, ECP; hormonal and nonhormonal IUDs; permanent sterilization options including vasectomy and the various tubal sterilization modalities. Risks, benefits, and typical effectiveness rates were reviewed.  Questions were answered.  Written information was also given to the patient to review.  Patient desires reinstitution of DMPA, this was prescribed for patient. She will follow up in  11-13 wks for surveillance.  She was told to call with any further questions, or with any concerns about this method of contraception.  Emphasized use of condoms 100% of the time for STI prevention.  Patient was not offered ECP due to not meeting criteria. ECP was not accepted by the patient. ECP counseling was not given - see RN documentation 1. Family planning   2. Encounter for surveillance of injectable contraceptive DMPA 150 mg IM today Please counsel on need for abstinance/back up condoms next 7 days Will need PE 09/2021   Please refer to After Visit Summary for other counseling recommendations.   Return in about 11 weeks (around 10/08/2021) for Physical and Depo, 11-13 wk DMPA.  Alberteen Spindle, CNM [facility] DEPARTMENT

## 2021-10-15 ENCOUNTER — Other Ambulatory Visit: Payer: Self-pay

## 2021-10-15 ENCOUNTER — Encounter: Payer: Self-pay | Admitting: Advanced Practice Midwife

## 2021-10-15 ENCOUNTER — Ambulatory Visit (LOCAL_COMMUNITY_HEALTH_CENTER): Payer: Self-pay | Admitting: Nurse Practitioner

## 2021-10-15 VITALS — BP 106/72 | Ht 60.5 in | Wt 136.0 lb

## 2021-10-15 DIAGNOSIS — Z3042 Encounter for surveillance of injectable contraceptive: Secondary | ICD-10-CM

## 2021-10-15 DIAGNOSIS — Z01419 Encounter for gynecological examination (general) (routine) without abnormal findings: Secondary | ICD-10-CM

## 2021-10-15 DIAGNOSIS — Z3009 Encounter for other general counseling and advice on contraception: Secondary | ICD-10-CM

## 2021-10-15 MED ORDER — MEDROXYPROGESTERONE ACETATE 150 MG/ML IM SUSP
150.0000 mg | INTRAMUSCULAR | Status: AC
  Administered 2021-10-15 – 2022-07-16 (×4): 150 mg via INTRAMUSCULAR

## 2021-10-15 NOTE — Progress Notes (Signed)
Patient changed her mind and declined STD screening and bloodwork today. Depo given and tolerated well. Return visit appt reminder card given with instructions to call ~ 12/31/2021 for next Depo. Condoms given. Tawny Hopping, RN

## 2021-10-15 NOTE — Progress Notes (Signed)
[facility] DEPARTMENT [facility] [address] Main Number: [phone]    Family Planning Visit- Initial Visit  Subjective:  Anita Ford is a 41 y.o.  [id number]   being seen today for an initial annual visit and to discuss reproductive life planning.  The patient is currently using Hormonal Injection for pregnancy prevention. Patient reports   does not want a pregnancy in the next year.  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  Chief Complaint  Patient presents with   Gynecologic Exam   Contraception    Patient reports to clinic today for a physical and depo.  Last depo 07/23/21 ([redacted]w[redacted]d).  Last sex 09/28/21.    Patient denies all signs and symptoms.  Denies current drug, alcohol, and cigarette use.    Body mass index is 26.12 kg/m. - Patient is eligible for diabetes screening based on BMI and age >4? Yes  HA1C ordered? No, patient declines blood work today  Patient reports 2  partner/s in last year. Desires STI screening?  No - patient declines   Has patient been screened once for HCV in the past?  No  No results found for: HCVAB  Does the patient have current drug use (including MJ), have a partner with drug use, and/or has been incarcerated since last result? No  If yes-- Screen for HCV through [facility]   Does the patient meet criteria for HBV testing? No  Criteria:  -Household, sexual or needle sharing contact with HBV -History of drug use -HIV positive -Those with known Hep C   Health Maintenance Due  Topic Date Due   COVID-19 Vaccine (1) Never done   Hepatitis C Screening  Never done   TETANUS/TDAP  Never done   PAP SMEAR-Modifier  12/15/2020   INFLUENZA VACCINE  Never done    Review of Systems  Constitutional:  Negative for chills, fever, malaise/fatigue and weight loss.  HENT:  Negative for congestion, hearing loss and sore throat.   Eyes:  Negative for  blurred vision, double vision and photophobia.  Respiratory:  Negative for shortness of breath.   Cardiovascular:  Negative for chest pain.  Gastrointestinal:  Negative for abdominal pain, blood in stool, constipation, diarrhea, heartburn, nausea and vomiting.  Genitourinary:  Negative for dysuria and frequency.  Musculoskeletal:  Negative for back pain, joint pain and neck pain.  Skin:  Negative for itching and rash.  Neurological:  Positive for dizziness. Negative for weakness and headaches.  Endo/Heme/Allergies:  Does not bruise/bleed easily.  Psychiatric/Behavioral:  Negative for depression, substance abuse and suicidal ideas.    The following portions of the patient's history were reviewed and updated as appropriate: allergies, current medications, past family history, past medical history, past social history, past surgical history and problem list. Problem list updated.   See flowsheet for other program required questions.  Objective:   Vitals:   10/15/21 1513  BP: 106/72  Weight: 136 lb (61.7 kg)  Height: 5' 0.5" (1.537 m)    Physical Exam Constitutional:      Appearance: Normal appearance.  HENT:     Head: Normocephalic.     Right Ear: External ear normal.     Left Ear: External ear normal.     Nose: Nose normal.     Mouth/Throat:     Mouth: Mucous membranes are moist.     Comments: No visible signs of dental caries.  Eyes:     Pupils:  Pupils are equal, round, and reactive to light.  Cardiovascular:     Rate and Rhythm: Normal rate and regular rhythm.  Pulmonary:     Effort: Pulmonary effort is normal.     Breath sounds: Normal breath sounds.  Chest:     Comments: Breasts:        Right: Normal. No swelling, mass, nipple discharge, skin change or tenderness.        Left: Normal. No swelling, mass, nipple discharge, skin change or tenderness.   Abdominal:     General: Abdomen is flat. Bowel sounds are normal.     Palpations: Abdomen is soft.  Genitourinary:     Comments: External genitalia/pubic area without nits, lice, edema, erythema, lesions and inguinal adenopathy. Vagina with normal mucosa and discharge. Cervix without visible lesions. Uterus firm, mobile, nt, no masses, no CMT, no adnexal tenderness or fullness.  Musculoskeletal:     Cervical back: Full passive range of motion without pain, normal range of motion and neck supple.  Skin:    General: Skin is warm and dry.  Neurological:     Mental Status: She is alert and oriented to person, place, and time.  Psychiatric:        Attention and Perception: Attention normal.        Mood and Affect: Mood normal.        Speech: Speech normal.        Behavior: Behavior is cooperative.      Assessment and Plan:  Anita Ford is a 41 y.o. female presenting to the [facility] Department for an initial annual wellness/contraceptive visit  Contraception counseling: Reviewed all forms of birth control options in the tiered based approach. available including abstinence; over the counter/barrier methods; hormonal contraceptive medication including pill, patch, ring, injection,contraceptive implant, ECP; hormonal and nonhormonal IUDs; permanent sterilization options including vasectomy and the various tubal sterilization modalities. Risks, benefits, and typical effectiveness rates were reviewed.  Questions were answered.  Written information was also given to the patient to review.  Patient desires Hormonal Injection, this was prescribed for patient.    The patient will follow up in  1 year for surveillance.  The patient was told to call with any further questions, or with any concerns about this method of contraception.  Emphasized use of condoms 100% of the time for STI prevention.  Patient was not offered ECP based on current contraception method.     1. Family planning counseling -41 year old female in clinic today  for a physical and Depo. -ROS reviewed, patient states that in the  beginning of the month she had dizziness for about two weeks.  Dizziness has subsided.  Patient states she began to eat more frequent healthier meals.  Also advised patient to incorporate more water daily. PCP list provided for patient.  -Patient declines blood work today and all STD screening.    2. Surveillance for Depo-Provera contraception -May have Depo 150 MG IM q 11-13 weeks x 1 year - medroxyPROGESTERone (DEPO-PROVERA) injection 150 mg  3. Well woman exam with routine gynecological exam -Noramal well woman exam. -CBE today, Next 09/2024 -PAP performed today  - IGP, Aptima HPV  Return in about 11 weeks (around 12/31/2021) for Routine DMPA injection.    Glenna Fellows, FNP

## 2021-10-15 NOTE — Progress Notes (Signed)
Here today for PE and Depo. Last PE here was 10/10/2020. Last Pap Smear was 12/15/2017 (NIL, HPV Neg.) Wants to continue with Depo. Wants STD screening today including bloodwork. Tawny Hopping, RN

## 2021-10-21 LAB — IGP, APTIMA HPV
HPV Aptima: NEGATIVE
PAP Smear Comment: 0

## 2022-01-02 ENCOUNTER — Ambulatory Visit: Payer: Self-pay

## 2022-01-27 ENCOUNTER — Ambulatory Visit (LOCAL_COMMUNITY_HEALTH_CENTER): Payer: Self-pay

## 2022-01-27 VITALS — BP 103/68 | Ht 60.5 in | Wt 144.5 lb

## 2022-01-27 DIAGNOSIS — Z3009 Encounter for other general counseling and advice on contraception: Secondary | ICD-10-CM

## 2022-01-27 DIAGNOSIS — Z3042 Encounter for surveillance of injectable contraceptive: Secondary | ICD-10-CM

## 2022-01-27 NOTE — Progress Notes (Signed)
14 weeks 6 days post depo. Denies problems with method.  Depo given IM RUOQ per order by Glenna Fellows FNP dated 10/15/21.  Tolerated well.  Next depo due 04/14/22 and appt reminder given.  Instructed pt try and keep return depo appt 11 weeks from last depo.  Language line interpreter # [phone].  Cherlynn Polo, RN

## 2022-04-24 ENCOUNTER — Ambulatory Visit: Payer: Self-pay

## 2022-04-24 ENCOUNTER — Ambulatory Visit (LOCAL_COMMUNITY_HEALTH_CENTER): Payer: Self-pay

## 2022-04-24 VITALS — BP 111/71 | Ht 60.51 in | Wt 141.0 lb

## 2022-04-24 DIAGNOSIS — Z3042 Encounter for surveillance of injectable contraceptive: Secondary | ICD-10-CM

## 2022-04-24 DIAGNOSIS — Z3009 Encounter for other general counseling and advice on contraception: Secondary | ICD-10-CM

## 2022-04-24 NOTE — Progress Notes (Signed)
12 weeks 3 days post depo. Voices no concerns. Depo given today per order by Onalee Hua, FNP dated 10/15/2021. Tolerated well LUOQ. Next depo due 07/10/2022, pt aware.  Judie Petit [place] interpreter. Jerel Shepherd, RN

## 2022-07-16 ENCOUNTER — Ambulatory Visit (LOCAL_COMMUNITY_HEALTH_CENTER): Payer: Self-pay

## 2022-07-16 VITALS — BP 101/75 | Ht 60.5 in | Wt 143.0 lb

## 2022-07-16 DIAGNOSIS — Z30013 Encounter for initial prescription of injectable contraceptive: Secondary | ICD-10-CM

## 2022-07-16 DIAGNOSIS — Z3042 Encounter for surveillance of injectable contraceptive: Secondary | ICD-10-CM

## 2022-07-16 DIAGNOSIS — Z309 Encounter for contraceptive management, unspecified: Secondary | ICD-10-CM

## 2022-07-16 NOTE — Progress Notes (Signed)
Interpretation provided by Language Line:  [phone] Anita Ford.  Client 11 weeks 6 days post depo.  Voices No concerns.  Depo given today per order by Glenna Fellows, FNP date 10/15/2021.  Tolerated well in RUOQ.  Next Depo and Physical due 10/01/2021.     Julliette Frentz Sherrilyn Rist, RN

## 2022-10-16 ENCOUNTER — Encounter: Payer: Self-pay | Admitting: Physician Assistant

## 2022-10-16 ENCOUNTER — Ambulatory Visit (LOCAL_COMMUNITY_HEALTH_CENTER): Payer: Self-pay | Admitting: Family Medicine

## 2022-10-16 VITALS — BP 106/73 | Ht 60.5 in | Wt 137.0 lb

## 2022-10-16 DIAGNOSIS — Z0000 Encounter for general adult medical examination without abnormal findings: Secondary | ICD-10-CM

## 2022-10-16 DIAGNOSIS — Z3009 Encounter for other general counseling and advice on contraception: Secondary | ICD-10-CM

## 2022-10-16 DIAGNOSIS — Z30013 Encounter for initial prescription of injectable contraceptive: Secondary | ICD-10-CM

## 2022-10-16 MED ORDER — MEDROXYPROGESTERONE ACETATE 150 MG/ML IM SUSP
150.0000 mg | INTRAMUSCULAR | Status: AC
  Administered 2022-10-16 – 2023-06-24 (×4): 150 mg via INTRAMUSCULAR

## 2022-10-16 NOTE — Progress Notes (Signed)
[facility] Main Number: [phone]   Family Planning Visit- Initial Visit  Subjective:  Anita Ford is a 42 y.o.  [id number]   being seen today for an annual visit and to discuss reproductive life planning.  The patient is currently using Hormonal Injection for pregnancy prevention. Patient reports   does not want a pregnancy in the next year.     report they are looking for a method that provides High efficacy at preventing pregnancy  Patient has the following medical conditions has Low-level of literacy and History of precipitous labor and delivery on their problem list.  Chief Complaint  Patient presents with   Annual Exam   Contraception    Patient reports she likes DMPA, is comfortable not having periods.  Patient denies concerns today.   Body mass index is 26.32 kg/m. - Patient is eligible for diabetes screening based on BMI> 25 and age >35?  yes HA1C ordered? yes  Patient reports 1  partner/s in last year. Desires STI screening?  No - declines.  Has patient been screened once for HCV in the past?  No  No results found for: "HCVAB"  Does the patient have current drug use (including MJ), have a partner with drug use, and/or has been incarcerated since last result? No  If yes-- Screen for HCV through [facility]   Does the patient meet criteria for HBV testing? No  Criteria:  -Household, sexual or needle sharing contact with HBV -History of drug use -HIV positive -Those with known Hep C   Health Maintenance Due  Topic Date Due   COVID-19 Vaccine (1) Never done   Hepatitis C Screening  Never done   DTaP/Tdap/Td (1 - Tdap) Never done   INFLUENZA VACCINE  Never done    Review of Systems  Constitutional: Negative.   HENT: Negative.    Eyes: Negative.   Respiratory: Negative.    Cardiovascular: Negative.   Gastrointestinal: Negative.   Genitourinary: Negative.   Musculoskeletal:  Negative.   Skin: Negative.    The following portions of the patient's history were reviewed and updated as appropriate: allergies, current medications, past family history, past medical history, past social history, past surgical history and problem list. Problem list updated.  See flowsheet for other program required questions.  Objective:   Vitals:   10/16/22 1517  BP: 106/73  Weight: 137 lb (62.1 kg)  Height: 5' 0.5" (1.537 m)    Physical Exam Constitutional:      Appearance: Normal appearance.  HENT:     Head: Normocephalic and atraumatic.  Pulmonary:     Effort: Pulmonary effort is normal.  Chest:  Breasts:    Tanner Score is 5.     Right: No mass, nipple discharge, skin change or tenderness.     Left: No mass, nipple discharge, skin change or tenderness.  Abdominal:     Palpations: Abdomen is soft.  Genitourinary:    Comments: Pt declines pelvic exam Musculoskeletal:        General: Normal range of motion.  Lymphadenopathy:     Upper Body:     Right upper body: No supraclavicular, axillary or pectoral adenopathy.     Left upper body: No supraclavicular, axillary or pectoral adenopathy.  Skin:    General: Skin is warm and dry.  Neurological:     General: No focal deficit present.     Mental Status: She is alert.  Psychiatric:  Mood and Affect: Mood normal.        Behavior: Behavior normal.    Assessment and Plan:  Anita Ford is a 42 y.o. female presenting to the [facility] Department for an initial annual wellness/contraceptive visit  Contraception counseling: Reviewed options based on patient desire and reproductive life plan. Patient is interested in Hormonal Injection. This was provided to the patient today.  Risks, benefits, and typical effectiveness rates were reviewed.  Questions were answered.  Written information was also given to the patient to review.    The patient will follow up in  3 months for surveillance.  The  patient was told to call with any further questions, or with any concerns about this method of contraception.  Emphasized use of condoms 100% of the time for STI prevention.  Need for ECP was assessed. Patient reported > 120 hours . No ECP indicated.  1. Family planning services Continue DMPA today and every 3 mo for 1 year. - medroxyPROGESTERone (DEPO-PROVERA) injection 150 mg  2. Encounter for well woman exam without gynecological exam Next Pap due 09/2024. Meets criteria for diabetes screening today, pt desires testing, plan to send letter with result. Discussed option of initiation of screening mammography - pt declines for now. Will readdress at next annual physical. Enc f/u with PCP and dentist for routine care. - Hgb A1c w/o eAG  Due to language barrier, an interpreter was present during the history-taking and subsequent discussion (and for part of the physical exam) with this patient.  Return in about 3 months (around 01/14/2023) for Routine DMPA injection.  No future appointments.  Lora Havens, PA-C

## 2022-10-16 NOTE — Progress Notes (Signed)
Here for PE and Depo. Last PE was 10/15/21, last Pap 10/15/21. Last Depo 07/16/2022, 13 1/7 since last Depo.Jenetta Downer, RN

## 2022-10-16 NOTE — Progress Notes (Signed)
Depo given, tolerated well, next depo card given.Jenetta Downer, RN

## 2022-10-17 LAB — HGB A1C W/O EAG: Hgb A1c MFr Bld: 5.7 % — ABNORMAL HIGH (ref 4.8–5.6)

## 2023-01-05 ENCOUNTER — Ambulatory Visit: Payer: Self-pay

## 2023-01-07 ENCOUNTER — Ambulatory Visit (LOCAL_COMMUNITY_HEALTH_CENTER): Payer: Self-pay

## 2023-01-07 VITALS — BP 109/68 | Ht 60.51 in | Wt 138.0 lb

## 2023-01-07 DIAGNOSIS — Z3009 Encounter for other general counseling and advice on contraception: Secondary | ICD-10-CM

## 2023-01-07 DIAGNOSIS — Z3042 Encounter for surveillance of injectable contraceptive: Secondary | ICD-10-CM

## 2023-01-07 NOTE — Progress Notes (Signed)
11 weeks 6 days post depo. Depo given today per order by A Streilein, PA-C. Tolerated well LUOQ. Next depo due 03/25/23, has reminder. M Yemen intepreter. Jerel Shepherd, RN

## 2023-03-26 ENCOUNTER — Ambulatory Visit: Payer: Self-pay

## 2023-04-06 ENCOUNTER — Ambulatory Visit (LOCAL_COMMUNITY_HEALTH_CENTER): Payer: Self-pay

## 2023-04-06 ENCOUNTER — Ambulatory Visit: Payer: Self-pay

## 2023-04-06 VITALS — BP 104/73 | Wt 140.0 lb

## 2023-04-06 DIAGNOSIS — Z3042 Encounter for surveillance of injectable contraceptive: Secondary | ICD-10-CM

## 2023-04-06 DIAGNOSIS — Z3009 Encounter for other general counseling and advice on contraception: Secondary | ICD-10-CM

## 2023-04-06 NOTE — Progress Notes (Signed)
12wks5days post depo. Voices no concerns. Depo given today per prder A.Streilein,PA dated 10/16/22. Tolerated well RUOQ.Next depo due 06/22/23.

## 2023-06-24 ENCOUNTER — Ambulatory Visit (LOCAL_COMMUNITY_HEALTH_CENTER): Payer: Self-pay

## 2023-06-24 VITALS — BP 105/65 | Ht 60.0 in | Wt 142.0 lb

## 2023-06-24 DIAGNOSIS — Z3009 Encounter for other general counseling and advice on contraception: Secondary | ICD-10-CM

## 2023-06-24 DIAGNOSIS — Z3042 Encounter for surveillance of injectable contraceptive: Secondary | ICD-10-CM

## 2023-06-24 NOTE — Progress Notes (Signed)
11 weeks 2 days post depo. Depo given today per order by A Streilein, PA-C. Tolerated well LUOQ. Next depo due 09/09/2023, reminder given. Local PCP list given per patient request. I Ebbie Ridge, interpreter today. Jerel Shepherd, RN

## 2023-09-09 ENCOUNTER — Ambulatory Visit: Payer: Self-pay

## 2023-09-09 VITALS — BP 102/65 | Ht 60.0 in | Wt 142.5 lb

## 2023-09-09 DIAGNOSIS — Z3009 Encounter for other general counseling and advice on contraception: Secondary | ICD-10-CM

## 2023-09-09 DIAGNOSIS — Z3042 Encounter for surveillance of injectable contraceptive: Secondary | ICD-10-CM

## 2023-09-09 MED ORDER — MEDROXYPROGESTERONE ACETATE 150 MG/ML IM SUSP
150.0000 mg | Freq: Once | INTRAMUSCULAR | Status: AC
  Administered 2023-09-09: 150 mg via INTRAMUSCULAR

## 2023-09-09 NOTE — Progress Notes (Signed)
11w 0d post depo. Voices no concerns. Depo given today per order by A. Streilein, PA-C dated 10/16/2022. Tolerated well in RUOQ. Patient annual physical due 10/17/2023, but patient plans to schedule physical with next depo due 11/25/2023.   Interpreter, Burley Saver [phone], helped during this visit.  Abagail Kitchens, RN

## 2023-11-25 ENCOUNTER — Encounter: Payer: Self-pay | Admitting: Family Medicine

## 2023-11-25 ENCOUNTER — Ambulatory Visit: Payer: Self-pay

## 2023-11-25 ENCOUNTER — Ambulatory Visit: Payer: Self-pay | Admitting: Family Medicine

## 2023-11-25 VITALS — BP 100/70 | HR 81 | Temp 97.5°F | Ht 60.05 in | Wt 147.6 lb

## 2023-11-25 DIAGNOSIS — Z113 Encounter for screening for infections with a predominantly sexual mode of transmission: Secondary | ICD-10-CM

## 2023-11-25 DIAGNOSIS — Z0000 Encounter for general adult medical examination without abnormal findings: Secondary | ICD-10-CM

## 2023-11-25 DIAGNOSIS — Z8742 Personal history of other diseases of the female genital tract: Secondary | ICD-10-CM

## 2023-11-25 DIAGNOSIS — Z3009 Encounter for other general counseling and advice on contraception: Secondary | ICD-10-CM

## 2023-11-25 DIAGNOSIS — Z3042 Encounter for surveillance of injectable contraceptive: Secondary | ICD-10-CM

## 2023-11-25 MED ORDER — MEDROXYPROGESTERONE ACETATE 150 MG/ML IM SUSP
150.0000 mg | INTRAMUSCULAR | Status: AC
  Administered 2023-11-25 – 2024-07-27 (×4): 150 mg via INTRAMUSCULAR

## 2023-11-25 MED ORDER — MEDROXYPROGESTERONE ACETATE 150 MG/ML IM SUSP: 150.0000 mg | INTRAMUSCULAR | Status: DC

## 2023-11-25 NOTE — Progress Notes (Signed)
 Declined all bloodwork today. Self-collected desired wet prep and gonorrhea / chlamydia labs. Wet prep negative. Jossie Ng, RN

## 2023-11-25 NOTE — Progress Notes (Signed)
 [employer] [facility] DEPART[facility] [address] [place] [address] Main phone: [phone]  Family Planning Visit - Repeat Yearly Visit  Subjective:  Anita Ford is a 43 y.o. [id number]  being seen today for an annual wellness visit and to discuss contraception options. The patient is currently using hormonal injection for pregnancy prevention. Patient does not want a pregnancy in the next year.   Patient reports they are looking for a method with the following characteristics:  High efficacy at preventing pregnancy  Patient has the following medical problems:  Patient Active Problem List   Diagnosis Date Noted   Low-level of literacy 12/15/2017   History of precipitous labor and delivery 04/15/2016    Chief Complaint  Patient presents with   Annual Exam    HPI Patient reports to clinic for annual PE and depo injection  Patient denies concerns about self today    Review of Systems  Constitutional:  Negative for weight loss.  Eyes:  Negative for blurred vision.  Respiratory:  Negative for cough and shortness of breath.   Cardiovascular:  Negative for claudication.  Gastrointestinal:  Negative for nausea.  Genitourinary:  Negative for dysuria and frequency.  Skin:  Negative for rash.  Neurological:  Negative for headaches.  Endo/Heme/Allergies:  Does not bruise/bleed easily.    See flowsheet for other program required questions.   Diabetes screening This patient is 43 y.o. with a BMI of Body mass index is 28.78 kg/m.Marland Kitchen  Is patient eligible for diabetes screening (age >35 and BMI >25)?  yes  Was Hgb A1c ordered? No -time constraint- and patient declined blood work today- address at next visit  STI screening Patient reports 1 of partners in last year.  Does this patient desire STI screening?  Yes  Hepatitis C screening Has patient been screened once for HCV in the past?  No  No results found for: "HCVAB"  Does  the patient meet criteria for HCV testing? No  (If yes-- Screen for HCV through [facility]) Criteria:  Since the last HCV result, does the patient have any of the following? - Current drug use - Have a partner with drug use - Has been incarcerated  Hepatitis B screening Does the patient meet criteria for HBV testing? No Criteria:  -Household, sexual or needle sharing contact with HBV -History of drug use -HIV positive -Those with known Hep C  Cervical Cancer Screening  Result Date Procedure Results Follow-ups  10/15/2021 IGP, Aptima HPV DIAGNOSIS:: Comment (A) HPV Aptima: Negative Specimen adequacy:: Comment Clinician Provided ICD10: Comment Performed by:: Comment Electronically signed by:: Comment PAP Smear Comment: . PATHOLOGIST PROVIDED ICD10:: Comment Note:: Comment Test Methodology: Comment Pap in 3 years: due 10/15/2024  12/15/2017 HM PAP SMEAR HM Pap smear: Neg/Neg HR HPV     Health Maintenance Due  Topic Date Due   Hepatitis C Screening  Never done   COVID-19 Vaccine (1 - 2024-25 season) Never done    The following portions of the patient's history were reviewed and updated as appropriate: allergies, current medications, past family history, past medical history, past social history, past surgical history and problem list. Problem list updated.  Objective:   Vitals:   11/25/23 1635  BP: 100/70  Pulse: 81  Temp: (!) 97.5 F (36.4 C)  Weight: 147 lb 9.6 oz (67 kg)  Height: 5' 0.05" (1.525 m)    Physical Exam Constitutional:      Appearance: Normal appearance.  HENT:  Head: Normocephalic and atraumatic.  Pulmonary:     Effort: Pulmonary effort is normal.  Abdominal:     Palpations: Abdomen is soft.  Musculoskeletal:        General: Normal range of motion.  Skin:    General: Skin is warm and dry.  Neurological:     General: No focal deficit present.     Mental Status: She is alert.  Psychiatric:        Mood and Affect: Mood normal.         Behavior: Behavior normal.     Assessment and Plan:  Anita Ford is a 43 y.o. female [phone] presenting to the [facility] Department for an yearly wellness and contraception visit  1. Encounter for surveillance of injectable contraceptive (Primary) Contraception counseling: Reviewed options based on patient desire and reproductive life plan. Patient is interested in Hormonal Injection. This was provided to the patient today.   Risks, benefits, and typical effectiveness rates were reviewed.  Questions were answered.  Written information was also given to the patient to review.    The patient will follow up in  3 months for surveillance.  The patient was told to call with any further questions, or with any concerns about this method of contraception.  Emphasized use of condoms 100% of the time for STI prevention.  Educated on ECP and assessed need for ECP. Not indicated- covered under depo window  - medroxyPROGESTERone (DEPO-PROVERA) injection 150 mg  2. Screening examination for venereal disease Declined blood work today   - Chlamydia/Gonorrhea [facility] - WET PREP FOR TRICH, YEAST, CLUE  3. Family planning -ok to continue depo   - medroxyPROGESTERone (DEPO-PROVERA) injection 150 mg  4. History of abnormal cervical Pap smear -repeat pap due in 09/2024  5. Well woman exam (no gynecological exam) -BP excellent today -has not had mammogram- given [facility]CP information pamphlet and encouraged to call for appointment -did not offer CBE today due to time constraint     Return in about 11 weeks (around 02/10/2024) for Depo.  No future appointments. Due to language barrier, a Spanish interpreter Roe Coombs, [phone] ) was present by phone during the history-taking, subsequent discussion, and physical exam with this patient.     Lenice Llamas, [place]

## 2023-11-25 NOTE — Progress Notes (Signed)
 Depo given and tolerated well. Reviewed with patient next Depo due 02/13/24. Tawny Hopping, RN

## 2023-11-26 LAB — WET PREP FOR TRICH, YEAST, CLUE
Trichomonas Exam: NEGATIVE
Yeast Exam: NEGATIVE

## 2024-02-11 ENCOUNTER — Ambulatory Visit: Payer: Self-pay

## 2024-02-23 ENCOUNTER — Ambulatory Visit: Payer: Self-pay

## 2024-02-23 VITALS — BP 99/67 | Ht 60.04 in | Wt 143.0 lb

## 2024-02-23 DIAGNOSIS — Z3042 Encounter for surveillance of injectable contraceptive: Secondary | ICD-10-CM

## 2024-02-23 DIAGNOSIS — Z3009 Encounter for other general counseling and advice on contraception: Secondary | ICD-10-CM

## 2024-02-23 NOTE — Progress Notes (Signed)
 12 weeks and 6 days post depo. Voices no concerns. Depo given today per order by VEAR Bers, FNP dated 11/25/2023. Tolerated well in RUOQ. Next depo due 05/10/2024; patient aware.  Interpreter, Franky [phone], helped during this visit.  Doyce CINDERELLA Shuck, RN

## 2024-05-10 ENCOUNTER — Ambulatory Visit: Payer: Self-pay

## 2024-05-10 VITALS — BP 106/72 | Ht 60.0 in | Wt 144.5 lb

## 2024-05-10 DIAGNOSIS — Z3042 Encounter for surveillance of injectable contraceptive: Secondary | ICD-10-CM

## 2024-05-10 DIAGNOSIS — Z30013 Encounter for initial prescription of injectable contraceptive: Secondary | ICD-10-CM

## 2024-05-10 DIAGNOSIS — Z3009 Encounter for other general counseling and advice on contraception: Secondary | ICD-10-CM

## 2024-05-10 NOTE — Progress Notes (Signed)
 11 weeks and 1 day post depo. Voices no concerns. Depo given today per order by VEAR Bers, FNP dated 11/25/2023. Tolerated well in LUOQ. Next depo due 07/26/2024; patient aware.  Interpreter, Marlene Yemen, helped during this visit.  Doyce CINDERELLA Shuck, RN

## 2024-06-29 ENCOUNTER — Ambulatory Visit: Payer: Self-pay

## 2024-07-20 ENCOUNTER — Ambulatory Visit: Payer: Self-pay

## 2024-07-27 ENCOUNTER — Ambulatory Visit (LOCAL_COMMUNITY_HEALTH_CENTER): Payer: Self-pay

## 2024-07-27 VITALS — BP 100/63 | Ht 60.0 in | Wt 142.5 lb

## 2024-07-27 DIAGNOSIS — Z3009 Encounter for other general counseling and advice on contraception: Secondary | ICD-10-CM

## 2024-07-27 DIAGNOSIS — Z30013 Encounter for initial prescription of injectable contraceptive: Secondary | ICD-10-CM

## 2024-07-27 DIAGNOSIS — Z3042 Encounter for surveillance of injectable contraceptive: Secondary | ICD-10-CM

## 2024-07-27 NOTE — Addendum Note (Signed)
 Addended byBETHA ANDRIA GIVENS on: 07/27/2024 05:55 PM   Modules accepted: Level of Service

## 2024-07-27 NOTE — Progress Notes (Signed)
 11weeks 1day post last depo. Voices no concerns today. Voices no spotting/bleeding between injections.Satisfied with method.Depo given per order from [facility], NP dated 11/25/23. Depo given in LUOQ. Next depo due 10/13/23 reminder card given.
